# Patient Record
Sex: Male | Born: 1945
Health system: Southern US, Community
[De-identification: ages and names within clinical notes are randomized; demographics above are authoritative.]

## PROBLEM LIST (undated history)

## (undated) DIAGNOSIS — G4730 Sleep apnea, unspecified: Secondary | ICD-10-CM

## (undated) DIAGNOSIS — Z87442 Personal history of urinary calculi: Secondary | ICD-10-CM

## (undated) DIAGNOSIS — I10 Essential (primary) hypertension: Secondary | ICD-10-CM

## (undated) DIAGNOSIS — E785 Hyperlipidemia, unspecified: Secondary | ICD-10-CM

## (undated) DIAGNOSIS — M1990 Unspecified osteoarthritis, unspecified site: Secondary | ICD-10-CM

## (undated) DIAGNOSIS — H814 Vertigo of central origin: Secondary | ICD-10-CM

## (undated) DIAGNOSIS — C61 Malignant neoplasm of prostate: Secondary | ICD-10-CM

## (undated) HISTORY — DX: Essential (primary) hypertension: I10

## (undated) HISTORY — PX: TONSILLECTOMY: SUR1361

## (undated) HISTORY — PX: PROSTATE BIOPSY: SHX241

## (undated) HISTORY — DX: Unspecified osteoarthritis, unspecified site: M19.90

## (undated) HISTORY — PX: GLAUCOMA SURGERY: SHX656

---

## 1988-01-27 HISTORY — PX: CARPAL TUNNEL RELEASE: SHX101

## 1997-10-02 ENCOUNTER — Ambulatory Visit (HOSPITAL_COMMUNITY): Admission: RE | Admit: 1997-10-02 | Discharge: 1997-10-02 | Payer: Self-pay | Admitting: Internal Medicine

## 1998-11-04 ENCOUNTER — Ambulatory Visit (HOSPITAL_COMMUNITY): Admission: RE | Admit: 1998-11-04 | Discharge: 1998-11-04 | Payer: Self-pay | Admitting: Gastroenterology

## 1998-11-23 ENCOUNTER — Ambulatory Visit: Admission: RE | Admit: 1998-11-23 | Discharge: 1998-11-23 | Payer: Self-pay | Admitting: Internal Medicine

## 1999-03-24 ENCOUNTER — Ambulatory Visit (HOSPITAL_BASED_OUTPATIENT_CLINIC_OR_DEPARTMENT_OTHER): Admission: RE | Admit: 1999-03-24 | Discharge: 1999-03-25 | Payer: Self-pay | Admitting: Otolaryngology

## 1999-07-11 ENCOUNTER — Encounter: Payer: Self-pay | Admitting: Internal Medicine

## 1999-07-11 ENCOUNTER — Ambulatory Visit (HOSPITAL_COMMUNITY): Admission: RE | Admit: 1999-07-11 | Discharge: 1999-07-11 | Payer: Self-pay | Admitting: Internal Medicine

## 2001-09-01 ENCOUNTER — Encounter: Payer: Self-pay | Admitting: Internal Medicine

## 2001-09-01 ENCOUNTER — Encounter: Admission: RE | Admit: 2001-09-01 | Discharge: 2001-09-01 | Payer: Self-pay | Admitting: Internal Medicine

## 2001-09-14 ENCOUNTER — Ambulatory Visit (HOSPITAL_COMMUNITY): Admission: RE | Admit: 2001-09-14 | Discharge: 2001-09-14 | Payer: Self-pay | Admitting: Internal Medicine

## 2003-02-23 ENCOUNTER — Ambulatory Visit (HOSPITAL_COMMUNITY): Admission: RE | Admit: 2003-02-23 | Discharge: 2003-02-23 | Payer: Self-pay | Admitting: Internal Medicine

## 2003-04-15 ENCOUNTER — Ambulatory Visit (HOSPITAL_COMMUNITY): Admission: RE | Admit: 2003-04-15 | Discharge: 2003-04-15 | Payer: Self-pay | Admitting: Orthopedic Surgery

## 2005-01-23 ENCOUNTER — Encounter: Admission: RE | Admit: 2005-01-23 | Discharge: 2005-01-23 | Payer: Self-pay | Admitting: Internal Medicine

## 2005-04-15 ENCOUNTER — Ambulatory Visit (HOSPITAL_COMMUNITY): Admission: RE | Admit: 2005-04-15 | Discharge: 2005-04-15 | Payer: Self-pay | Admitting: Cardiology

## 2010-03-27 ENCOUNTER — Other Ambulatory Visit: Payer: Self-pay | Admitting: Otolaryngology

## 2010-05-02 ENCOUNTER — Other Ambulatory Visit: Payer: Self-pay | Admitting: Internal Medicine

## 2010-05-02 ENCOUNTER — Ambulatory Visit
Admission: RE | Admit: 2010-05-02 | Discharge: 2010-05-02 | Disposition: A | Discharge status: Home / Self Care | Payer: Medicare Other | Source: Ambulatory Visit | Attending: Internal Medicine | Admitting: Internal Medicine

## 2010-05-02 DIAGNOSIS — I10 Essential (primary) hypertension: Secondary | ICD-10-CM

## 2010-05-26 ENCOUNTER — Encounter: Payer: Self-pay | Admitting: Internal Medicine

## 2010-05-26 ENCOUNTER — Other Ambulatory Visit: Payer: Self-pay | Admitting: Internal Medicine

## 2010-05-26 DIAGNOSIS — N529 Male erectile dysfunction, unspecified: Secondary | ICD-10-CM | POA: Insufficient documentation

## 2010-05-26 DIAGNOSIS — G4733 Obstructive sleep apnea (adult) (pediatric): Secondary | ICD-10-CM | POA: Insufficient documentation

## 2010-05-26 DIAGNOSIS — E785 Hyperlipidemia, unspecified: Secondary | ICD-10-CM | POA: Insufficient documentation

## 2010-05-26 DIAGNOSIS — I10 Essential (primary) hypertension: Secondary | ICD-10-CM | POA: Insufficient documentation

## 2010-05-26 DIAGNOSIS — N400 Enlarged prostate without lower urinary tract symptoms: Secondary | ICD-10-CM | POA: Insufficient documentation

## 2011-09-04 ENCOUNTER — Other Ambulatory Visit: Payer: Self-pay | Admitting: Internal Medicine

## 2012-03-03 LAB — BASIC METABOLIC PANEL
BUN: 11 mg/dL (ref 4–21)
Creatinine: 1.1 mg/dL (ref 0.6–1.3)
Glucose: 95 mg/dL

## 2012-03-03 LAB — HEPATIC FUNCTION PANEL
AST: 16 U/L (ref 14–40)
Alkaline Phosphatase: 80 U/L (ref 25–125)

## 2012-04-11 ENCOUNTER — Encounter: Payer: Self-pay | Admitting: General Practice

## 2013-08-14 ENCOUNTER — Emergency Department (HOSPITAL_BASED_OUTPATIENT_CLINIC_OR_DEPARTMENT_OTHER)
Admission: EM | Admit: 2013-08-14 | Discharge: 2013-08-14 | Disposition: A | Discharge status: Home / Self Care | Payer: No Typology Code available for payment source | Attending: Emergency Medicine | Admitting: Emergency Medicine

## 2013-08-14 ENCOUNTER — Encounter (HOSPITAL_BASED_OUTPATIENT_CLINIC_OR_DEPARTMENT_OTHER): Payer: Self-pay | Admitting: Emergency Medicine

## 2013-08-14 ENCOUNTER — Emergency Department (HOSPITAL_BASED_OUTPATIENT_CLINIC_OR_DEPARTMENT_OTHER): Payer: No Typology Code available for payment source

## 2013-08-14 DIAGNOSIS — Z791 Long term (current) use of non-steroidal anti-inflammatories (NSAID): Secondary | ICD-10-CM | POA: Insufficient documentation

## 2013-08-14 DIAGNOSIS — IMO0002 Reserved for concepts with insufficient information to code with codable children: Secondary | ICD-10-CM | POA: Diagnosis present

## 2013-08-14 DIAGNOSIS — Z880 Allergy status to penicillin: Secondary | ICD-10-CM | POA: Diagnosis not present

## 2013-08-14 DIAGNOSIS — M1990 Unspecified osteoarthritis, unspecified site: Secondary | ICD-10-CM | POA: Diagnosis not present

## 2013-08-14 DIAGNOSIS — Y9389 Activity, other specified: Secondary | ICD-10-CM | POA: Insufficient documentation

## 2013-08-14 DIAGNOSIS — I10 Essential (primary) hypertension: Secondary | ICD-10-CM | POA: Diagnosis not present

## 2013-08-14 DIAGNOSIS — Y92410 Unspecified street and highway as the place of occurrence of the external cause: Secondary | ICD-10-CM | POA: Diagnosis not present

## 2013-08-14 DIAGNOSIS — M545 Low back pain, unspecified: Secondary | ICD-10-CM

## 2013-08-14 DIAGNOSIS — Z79899 Other long term (current) drug therapy: Secondary | ICD-10-CM | POA: Diagnosis not present

## 2013-08-14 MED ORDER — CYCLOBENZAPRINE HCL 5 MG PO TABS: 5.0000 mg | ORAL_TABLET | Freq: Three times a day (TID) | ORAL | Status: DC | PRN

## 2013-08-14 MED ORDER — IBUPROFEN 800 MG PO TABS: 800.0000 mg | ORAL_TABLET | Freq: Three times a day (TID) | ORAL | Status: DC

## 2013-08-14 MED ORDER — OXYCODONE-ACETAMINOPHEN 5-325 MG PO TABS
1.0000 | ORAL_TABLET | Freq: Once | ORAL | Status: DC
  Filled 2013-08-14: qty 1

## 2013-08-14 MED ORDER — OXYCODONE-ACETAMINOPHEN 5-325 MG PO TABS: 1.0000 | ORAL_TABLET | Freq: Four times a day (QID) | ORAL | Status: DC | PRN
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
## 2013-08-14 NOTE — Discharge Instructions (Signed)
Returnt to the ED with any concerns including weakness of legs, not able to urinate, loss of control of bowel or bladder, chest or abdominal pain, decreased level of alertness/lethargy, or any other alarming symptoms

## 2014-10-16 ENCOUNTER — Encounter (HOSPITAL_BASED_OUTPATIENT_CLINIC_OR_DEPARTMENT_OTHER): Payer: Self-pay

## 2014-10-16 ENCOUNTER — Emergency Department (HOSPITAL_BASED_OUTPATIENT_CLINIC_OR_DEPARTMENT_OTHER)
Admission: EM | Admit: 2014-10-16 | Discharge: 2014-10-16 | Disposition: A | Discharge status: Home / Self Care | Payer: Medicare Other | Attending: Emergency Medicine | Admitting: Emergency Medicine

## 2014-10-16 DIAGNOSIS — Z23 Encounter for immunization: Secondary | ICD-10-CM | POA: Diagnosis not present

## 2014-10-16 DIAGNOSIS — Y9389 Activity, other specified: Secondary | ICD-10-CM | POA: Diagnosis not present

## 2014-10-16 DIAGNOSIS — Y9289 Other specified places as the place of occurrence of the external cause: Secondary | ICD-10-CM | POA: Diagnosis not present

## 2014-10-16 DIAGNOSIS — I10 Essential (primary) hypertension: Secondary | ICD-10-CM | POA: Diagnosis not present

## 2014-10-16 DIAGNOSIS — Z8739 Personal history of other diseases of the musculoskeletal system and connective tissue: Secondary | ICD-10-CM | POA: Diagnosis not present

## 2014-10-16 DIAGNOSIS — Z79899 Other long term (current) drug therapy: Secondary | ICD-10-CM | POA: Diagnosis not present

## 2014-10-16 DIAGNOSIS — T63461A Toxic effect of venom of wasps, accidental (unintentional), initial encounter: Secondary | ICD-10-CM | POA: Diagnosis not present

## 2014-10-16 DIAGNOSIS — Y288XXA Contact with other sharp object, undetermined intent, initial encounter: Secondary | ICD-10-CM | POA: Insufficient documentation

## 2014-10-16 DIAGNOSIS — Y998 Other external cause status: Secondary | ICD-10-CM | POA: Diagnosis not present

## 2014-10-16 DIAGNOSIS — S61219A Laceration without foreign body of unspecified finger without damage to nail, initial encounter: Secondary | ICD-10-CM

## 2014-10-16 DIAGNOSIS — T63481A Toxic effect of venom of other arthropod, accidental (unintentional), initial encounter: Secondary | ICD-10-CM

## 2014-10-16 DIAGNOSIS — Z880 Allergy status to penicillin: Secondary | ICD-10-CM | POA: Insufficient documentation

## 2014-10-16 DIAGNOSIS — S61217A Laceration without foreign body of left little finger without damage to nail, initial encounter: Secondary | ICD-10-CM | POA: Diagnosis present

## 2014-10-16 MED ORDER — TETANUS-DIPHTH-ACELL PERTUSSIS 5-2.5-18.5 LF-MCG/0.5 IM SUSP
0.5000 mL | Freq: Once | INTRAMUSCULAR | Status: AC
  Administered 2014-10-16: 0.5 mL via INTRAMUSCULAR
  Filled 2014-10-16: qty 0.5

## 2014-10-16 MED ORDER — LIDOCAINE HCL (PF) 1 % IJ SOLN
5.0000 mL | Freq: Once | INTRAMUSCULAR | Status: AC
  Administered 2014-10-16: 5 mL
  Filled 2014-10-16: qty 5

## 2014-10-16 NOTE — Discharge Instructions (Signed)
Bee, Wasp, or Hornet Sting Your caregiver has diagnosed you as having an insect sting. An insect sting appears as a red lump in the skin that sometimes has a tiny hole in the center, or it may have a stinger in the center of the wound. The most common stings are from wasps, hornets and bees. Individuals have different reactions to insect stings.  A normal reaction may cause pain, swelling, and redness around the sting site.  A localized allergic reaction may cause swelling and redness that extends beyond the sting site.  A large local reaction may continue to develop over the next 12 to 36 hours.  On occasion, the reactions can be severe (anaphylactic reaction). An anaphylactic reaction may cause wheezing; difficulty breathing; chest pain; fainting; raised, itchy, red patches on the skin; a sick feeling to your stomach (nausea); vomiting; cramping; or diarrhea. If you have had an anaphylactic reaction to an insect sting in the past, you are more likely to have one again. HOME CARE INSTRUCTIONS   With bee stings, a small sac of poison is left in the wound. Brushing across this with something such as a credit card, or anything similar, will help remove this and decrease the amount of the reaction. This same procedure will not help a wasp sting as they do not leave behind a stinger and poison sac.  Apply a cold compress for 10 to 20 minutes every hour for 1 to 2 days, depending on severity, to reduce swelling and itching.  To lessen pain, a paste made of water and baking soda may be rubbed on the bite or sting and left on for 5 minutes.  To relieve itching and swelling, you may use take medication or apply medicated creams or lotions as directed.  Only take over-the-counter or prescription medicines for pain, discomfort, or fever as directed by your caregiver.  Wash the sting site daily with soap and water. Apply antibiotic ointment on the sting site as directed.  If you suffered a severe  reaction:  If you did not require hospitalization, an adult will need to stay with you for 24 hours in case the symptoms return.  You may need to wear a medical bracelet or necklace stating the allergy.  You and your family need to learn when and how to use an anaphylaxis kit or epinephrine injection.  If you have had a severe reaction before, always carry your anaphylaxis kit with you. SEEK MEDICAL CARE IF:   None of the above helps within 2 to 3 days.  The area becomes red, warm, tender, and swollen beyond the area of the bite or sting.  You have an oral temperature above 102 F (38.9 C). SEEK IMMEDIATE MEDICAL CARE IF:  You have symptoms of an allergic reaction which are:  Wheezing.  Difficulty breathing.  Chest pain.  Lightheadedness or fainting.  Itchy, raised, red patches on the skin.  Nausea, vomiting, cramping or diarrhea. ANY OF THESE SYMPTOMS MAY REPRESENT A SERIOUS PROBLEM THAT IS AN EMERGENCY. Do not wait to see if the symptoms will go away. Get medical help right away. Call your local emergency services (911 in [place] DO NOT drive yourself to the hospital. MAKE SURE YOU:   Understand these instructions.  Will watch your condition.  Will get help right away if you are not doing well or get worse. Document Released: 01/12/2005 Document Revised: 04/06/2011 Document Reviewed: 06/29/2009 [facility] Patient Information 2015 [place], [place]. This information is not intended to replace advice given  to you by your health care provider. Make sure you discuss any questions you have with your health care provider.  Laceration Care, Adult A laceration is a cut or lesion that goes through all layers of the skin and into the tissue just beneath the skin. TREATMENT  Some lacerations may not require closure. Some lacerations may not be able to be closed due to an increased risk of infection. It is important to see your caregiver as soon as possible after an injury to minimize  the risk of infection and maximize the opportunity for successful closure. If closure is appropriate, pain medicines may be given, if needed. The wound will be cleaned to help prevent infection. Your caregiver will use stitches (sutures), staples, wound glue (adhesive), or skin adhesive strips to repair the laceration. These tools bring the skin edges together to allow for faster healing and a better cosmetic outcome. However, all wounds will heal with a scar. Once the wound has healed, scarring can be minimized by covering the wound with sunscreen during the day for 1 full year. HOME CARE INSTRUCTIONS  For sutures or staples:  Keep the wound clean and dry.  If you were given a bandage (dressing), you should change it at least once a day. Also, change the dressing if it becomes wet or dirty, or as directed by your caregiver.  Wash the wound with soap and water 2 times a day. Rinse the wound off with water to remove all soap. Pat the wound dry with a clean towel.  After cleaning, apply a thin layer of the antibiotic ointment as recommended by your caregiver. This will help prevent infection and keep the dressing from sticking.  You may shower as usual after the first 24 hours. Do not soak the wound in water until the sutures are removed.  Only take over-the-counter or prescription medicines for pain, discomfort, or fever as directed by your caregiver.  Get your sutures or staples removed as directed by your caregiver. For skin adhesive strips:  Keep the wound clean and dry.  Do not get the skin adhesive strips wet. You may bathe carefully, using caution to keep the wound dry.  If the wound gets wet, pat it dry with a clean towel.  Skin adhesive strips will fall off on their own. You may trim the strips as the wound heals. Do not remove skin adhesive strips that are still stuck to the wound. They will fall off in time. For wound adhesive:  You may briefly wet your wound in the shower or  bath. Do not soak or scrub the wound. Do not swim. Avoid periods of heavy perspiration until the skin adhesive has fallen off on its own. After showering or bathing, gently pat the wound dry with a clean towel.  Do not apply liquid medicine, cream medicine, or ointment medicine to your wound while the skin adhesive is in place. This may loosen the film before your wound is healed.  If a dressing is placed over the wound, be careful not to apply tape directly over the skin adhesive. This may cause the adhesive to be pulled off before the wound is healed.  Avoid prolonged exposure to sunlight or tanning lamps while the skin adhesive is in place. Exposure to ultraviolet light in the first year will darken the scar.  The skin adhesive will usually remain in place for 5 to 10 days, then naturally fall off the skin. Do not pick at the adhesive film. You may need  a tetanus shot if:  You cannot remember when you had your last tetanus shot.  You have never had a tetanus shot. If you get a tetanus shot, your arm may swell, get red, and feel warm to the touch. This is common and not a problem. If you need a tetanus shot and you choose not to have one, there is a rare chance of getting tetanus. Sickness from tetanus can be serious. SEEK MEDICAL CARE IF:   You have redness, swelling, or increasing pain in the wound.  You see a red line that goes away from the wound.  You have yellowish-white fluid (pus) coming from the wound.  You have a fever.  You notice a bad smell coming from the wound or dressing.  Your wound breaks open before or after sutures have been removed.  You notice something coming out of the wound such as wood or glass.  Your wound is on your hand or foot and you cannot move a finger or toe. SEEK IMMEDIATE MEDICAL CARE IF:   Your pain is not controlled with prescribed medicine.  You have severe swelling around the wound causing pain and numbness or a change in color in your  arm, hand, leg, or foot.  Your wound splits open and starts bleeding.  You have worsening numbness, weakness, or loss of function of any joint around or beyond the wound.  You develop painful lumps near the wound or on the skin anywhere on your body. MAKE SURE YOU:   Understand these instructions.  Will watch your condition.  Will get help right away if you are not doing well or get worse. Document Released: 01/12/2005 Document Revised: 04/06/2011 Document Reviewed: 07/08/2010 [facility] Patient Information 2015 [place], [place]. This information is not intended to replace advice given to you by your health care provider. Make sure you discuss any questions you have with your health care provider.

## 2014-10-16 NOTE — ED Notes (Signed)
dsg and splint applied to left 5 digit as ordered by EDP

## 2014-10-16 NOTE — ED Notes (Addendum)
C/o lac to left index finger while cutting weeds and was stung by yellow jackets

## 2014-10-16 NOTE — ED Notes (Signed)
Verified tetanus status with pt

## 2014-10-16 NOTE — ED Notes (Signed)
MD at bedside. 

## 2014-10-16 NOTE — ED Notes (Signed)
Pt teaching done re: S&S of infection

## 2014-10-16 NOTE — ED Provider Notes (Signed)
CSN: [id number]     Arrival date & time 10/16/14  1157 History   First MD Initiated Contact with Patient 10/16/14 1206     Chief Complaint  Patient presents with  . Extremity Laceration  . Insect Bite     (Consider location/radiation/quality/duration/timing/severity/associated sxs/prior Treatment) The history is provided by the patient.   patient was out working in the ER. States that he had to yellow jacket stings. One to his left head/face wander with right upper extremity. No history of allergies. States the stings burn and itch. Also had a laceration to his left little finger. States that he caught it with the tool he was cutting the bushes with. States continues to bleed even though it was around 3 hours ago. He is not on anticoagulation. No numbness or weakness.  Past Medical History  Diagnosis Date  . Hypertension   . Degenerative joint disease    History reviewed. No pertinent past surgical history. No family history on file. Social History  Substance Use Topics  . Smoking status: Never Smoker   . Smokeless tobacco: None  . Alcohol Use: No    Review of Systems  Constitutional: Negative for diaphoresis and fatigue.  HENT: Negative for trouble swallowing and voice change.   Respiratory: Negative for shortness of breath.   Cardiovascular: Negative for chest pain.  Gastrointestinal: Negative for abdominal pain.  Skin: Positive for wound.  Neurological: Negative for weakness and numbness.  Hematological: Does not bruise/bleed easily.      Allergies  Penicillins  Home Medications   Prior to Admission medications   Medication Sig Start Date End Date Taking? Authorizing Provider  doxazosin (CARDURA) 4 MG tablet Take 4 mg by mouth at bedtime.    Historical Provider, MD  finasteride (PROSCAR) 5 MG tablet Take 5 mg by mouth daily.    Historical Provider, MD  rosuvastatin (CRESTOR) 5 MG tablet Take 5 mg by mouth daily.    Historical Provider, MD   BP 147/88 mmHg   Pulse 90  Temp(Src) 98.3 F (36.8 C) (Oral)  Resp 18  Ht 5\' 10"  (1.778 m)  Wt 190 lb (86.183 kg)  BMI 27.26 kg/m2  SpO2 100% Physical Exam  Constitutional: He appears well-developed.  HENT:  Mild swelling to left temporal area from yellow jacket sting.  Cardiovascular: Normal rate.   Pulmonary/Chest: Effort normal.  Abdominal: Soft.  Musculoskeletal: He exhibits no tenderness.  Skin: Skin is warm.  1 cm laceration horizontally across distal phalanx of left index finger. Just distal to the DIP joint. Good flexion-extension at the DIP joint. Sensation intact distally. Nail does not appear to be involved.    ED Course  Procedures (including critical care time) Labs Review Labs Reviewed - No data to display  Imaging Review No results found. I have personally reviewed and evaluated these images and lab results as part of my medical decision-making.   EKG Interpretation None      MDM   Final diagnoses:  Finger laceration, initial encounter  Insect sting, accidental or unintentional, initial encounter    Patient with finger laceration and yellow jacket stings. Laceration closed and tetanus updated. Stings just require symptomatic relief. Will discharge home.  LACERATION REPAIR Performed by: Mackie Pai Authorized by: Mackie Pai Consent: Verbal consent obtained. Risks and benefits: risks, benefits and alternatives were discussed Consent given by: patient Patient identity confirmed: provided demographic data Prepped and Draped in normal sterile fashion Wound explored  Laceration Location: left 5th finger  Laceration Length: 1cm  No Foreign Bodies seen or palpated  Anesthesia: digital block  Local anesthetic: lidocaine 1%   Anesthetic total: 2 ml  Irrigation method: syringe Amount of cleaning: standard  Skin closure: 5-0 vicryl rapide  Number of sutures: 3  Technique: simple interrupted.  Patient tolerance: Patient tolerated the  procedure well with no immediate complications.  Davonna Belling, MD 10/16/14 [phone]

## 2014-10-16 NOTE — ED Notes (Signed)
Pt rec laceration, irregular in shape to left 5 digit from yard work today, mild active bleeding noted, easily controlled

## 2014-10-23 ENCOUNTER — Encounter (HOSPITAL_BASED_OUTPATIENT_CLINIC_OR_DEPARTMENT_OTHER): Payer: Self-pay | Admitting: *Deleted

## 2014-10-23 ENCOUNTER — Emergency Department (HOSPITAL_BASED_OUTPATIENT_CLINIC_OR_DEPARTMENT_OTHER)
Admission: EM | Admit: 2014-10-23 | Discharge: 2014-10-23 | Disposition: A | Discharge status: Home / Self Care | Payer: Medicare Other | Attending: Emergency Medicine | Admitting: Emergency Medicine

## 2014-10-23 DIAGNOSIS — I10 Essential (primary) hypertension: Secondary | ICD-10-CM | POA: Diagnosis not present

## 2014-10-23 DIAGNOSIS — Z79899 Other long term (current) drug therapy: Secondary | ICD-10-CM | POA: Diagnosis not present

## 2014-10-23 DIAGNOSIS — Z4802 Encounter for removal of sutures: Secondary | ICD-10-CM | POA: Diagnosis present

## 2014-10-23 DIAGNOSIS — Z8739 Personal history of other diseases of the musculoskeletal system and connective tissue: Secondary | ICD-10-CM | POA: Diagnosis not present

## 2014-10-23 DIAGNOSIS — Z880 Allergy status to penicillin: Secondary | ICD-10-CM | POA: Diagnosis not present

## 2014-10-23 NOTE — ED Notes (Signed)
Patient is here for suture removal from left fifth finger.

## 2014-10-23 NOTE — ED Provider Notes (Signed)
CSN: [id number]     Arrival date & time 10/23/14  3875 History   First MD Initiated Contact with Patient 10/23/14 [phone]     Chief Complaint  Patient presents with  . Suture / Staple Removal    Patient is a 69 y.o. male presenting with suture removal.  Suture / Staple Removal   KEY CEN is a 69 y.o. male who presents to ED for suture removal s/p laceration to his left 5th finger. Patient does not report any problems with wound healing. He has good function of finger without pain. Has no other complaints.    Past Medical History  Diagnosis Date  . Hypertension   . Degenerative joint disease    Past Surgical History  Procedure Laterality Date  . Carpal tunnel release  1990   No family history on file. Social History  Substance Use Topics  . Smoking status: Never Smoker   . Smokeless tobacco: None  . Alcohol Use: No    Review of Systems  All other systems reviewed and are negative.  Allergies  Penicillins  Home Medications   Prior to Admission medications   Medication Sig Start Date End Date Taking? Authorizing Provider  PRESCRIPTION MEDICATION Eye drops for Glaucoma   Yes Historical Provider, MD  doxazosin (CARDURA) 4 MG tablet Take 4 mg by mouth at bedtime.    Historical Provider, MD  finasteride (PROSCAR) 5 MG tablet Take 5 mg by mouth daily.    Historical Provider, MD  rosuvastatin (CRESTOR) 5 MG tablet Take 5 mg by mouth daily.    Historical Provider, MD   BP 153/83 mmHg  Pulse 76  Temp(Src) 97.7 F (36.5 C) (Oral)  Resp 20  Ht 5\' 6"  (1.676 m)  Wt 198 lb (89.812 kg)  BMI 31.97 kg/m2  SpO2 100% Physical Exam  Constitutional: He appears well-developed and well-nourished. No distress.  HENT:  Head: Normocephalic and atraumatic.  Eyes: EOM are normal.  Cardiovascular: Normal rate.   Pulmonary/Chest: Effort normal.  Musculoskeletal:  1 cm laceration horizontally across distal phalanx of left index finger that is well healed. Three sutures in place.  Good flexion-extension at the DIP joint. Sensation intact.    Neurological: He is alert.  Skin: Skin is warm and dry.  Psychiatric: He has a normal mood and affect.    ED Course  Procedures (including critical care time) Labs Review Labs Reviewed - No data to display  Imaging Review No results found. I have personally reviewed and evaluated these images and lab results as part of my medical decision-making.   EKG Interpretation None      MDM   Final diagnoses:  Encounter for removal of sutures   Patient presented to ED for suture removal. Procedure note below. Function and strength intact. Laceration well healed without signs of infection.    SUTURE REMOVAL Performed by: Luiz Blare  Consent: Verbal consent obtained. Patient identity confirmed: provided demographic data Time out: Immediately prior to procedure a "time out" was called to verify the correct patient, procedure, equipment, support staff and site/side marked as required.  Location details: left 5th finger  Wound Appearance: clean  Sutures/Staples Removed: 3 sutures removed  Facility: sutures placed in this facility Patient tolerance: Patient tolerated the procedure well with no immediate complications.   Luiz Blare, DO 10/23/2014, 9:07 AM PGY-2, [place], DO 10/23/14 2951  Charlesetta Shanks, MD 10/23/14 [phone]

## 2014-10-23 NOTE — Discharge Instructions (Signed)

## 2016-05-20 ENCOUNTER — Emergency Department (HOSPITAL_BASED_OUTPATIENT_CLINIC_OR_DEPARTMENT_OTHER)
Admission: EM | Admit: 2016-05-20 | Discharge: 2016-05-20 | Disposition: A | Discharge status: Home / Self Care | Payer: Medicare Other | Attending: Emergency Medicine | Admitting: Emergency Medicine

## 2016-05-20 ENCOUNTER — Encounter (HOSPITAL_BASED_OUTPATIENT_CLINIC_OR_DEPARTMENT_OTHER): Payer: Self-pay | Admitting: Emergency Medicine

## 2016-05-20 DIAGNOSIS — Z79899 Other long term (current) drug therapy: Secondary | ICD-10-CM | POA: Diagnosis not present

## 2016-05-20 DIAGNOSIS — R112 Nausea with vomiting, unspecified: Secondary | ICD-10-CM | POA: Diagnosis not present

## 2016-05-20 DIAGNOSIS — R42 Dizziness and giddiness: Secondary | ICD-10-CM | POA: Diagnosis not present

## 2016-05-20 DIAGNOSIS — I10 Essential (primary) hypertension: Secondary | ICD-10-CM | POA: Insufficient documentation

## 2016-05-20 LAB — CBC WITH DIFFERENTIAL/PLATELET
BASOS ABS: 0 10*3/uL (ref 0.0–0.1)
BASOS PCT: 0 %
EOS PCT: 1 %
Eosinophils Absolute: 0.1 10*3/uL (ref 0.0–0.7)
HCT: 45.4 % (ref 39.0–52.0)
Hemoglobin: 15.6 g/dL (ref 13.0–17.0)
Lymphocytes Relative: 18 %
Lymphs Abs: 1.4 10*3/uL (ref 0.7–4.0)
MCH: 31 pg (ref 26.0–34.0)
MCHC: 34.4 g/dL (ref 30.0–36.0)
MCV: 90.3 fL (ref 78.0–100.0)
MONO ABS: 0.7 10*3/uL (ref 0.1–1.0)
MONOS PCT: 10 %
Neutro Abs: 5.3 10*3/uL (ref 1.7–7.7)
Neutrophils Relative %: 71 %
PLATELETS: 181 10*3/uL (ref 150–400)
RBC: 5.03 MIL/uL (ref 4.22–5.81)
RDW: 14.1 % (ref 11.5–15.5)
WBC: 7.6 10*3/uL (ref 4.0–10.5)

## 2016-05-20 LAB — COMPREHENSIVE METABOLIC PANEL
ALT: 23 U/L (ref 17–63)
ANION GAP: 9 (ref 5–15)
AST: 19 U/L (ref 15–41)
Albumin: 4.6 g/dL (ref 3.5–5.0)
Alkaline Phosphatase: 70 U/L (ref 38–126)
BUN: 13 mg/dL (ref 6–20)
CHLORIDE: 103 mmol/L (ref 101–111)
CO2: 27 mmol/L (ref 22–32)
Calcium: 9.4 mg/dL (ref 8.9–10.3)
Creatinine, Ser: 1.26 mg/dL — ABNORMAL HIGH (ref 0.61–1.24)
GFR calc Af Amer: >60 mL/min (ref >60)
GFR, EST NON AFRICAN AMERICAN: 56 mL/min — AB (ref >60)
Glucose, Bld: 108 mg/dL — ABNORMAL HIGH (ref 65–99)
POTASSIUM: 4.2 mmol/L (ref 3.5–5.1)
Sodium: 139 mmol/L (ref 135–145)
TOTAL PROTEIN: 8.3 g/dL — AB (ref 6.5–8.1)
Total Bilirubin: 0.6 mg/dL (ref 0.3–1.2)

## 2016-05-20 LAB — LIPASE, BLOOD: LIPASE: 25 U/L (ref 11–51)

## 2016-05-20 LAB — TROPONIN I

## 2016-05-20 MED ORDER — MECLIZINE HCL 12.5 MG PO TABS: 12.5000 mg | ORAL_TABLET | Freq: Three times a day (TID) | ORAL | 0 refills | Status: AC | PRN

## 2016-05-20 MED ORDER — ONDANSETRON HCL 4 MG PO TABS: 4.0000 mg | ORAL_TABLET | Freq: Four times a day (QID) | ORAL | 0 refills | Status: DC

## 2016-05-20 MED ORDER — ONDANSETRON HCL 4 MG/2ML IJ SOLN
4.0000 mg | Freq: Once | INTRAMUSCULAR | Status: AC
  Administered 2016-05-20: 4 mg via INTRAVENOUS
  Filled 2016-05-20: qty 2

## 2016-05-20 MED ORDER — MECLIZINE HCL 25 MG PO TABS
12.5000 mg | ORAL_TABLET | Freq: Once | ORAL | Status: AC
  Administered 2016-05-20: 12.5 mg via ORAL
  Filled 2016-05-20: qty 1

## 2016-05-20 MED ORDER — SODIUM CHLORIDE 0.9 % IV BOLUS (SEPSIS)
1000.0000 mL | Freq: Once | INTRAVENOUS | Status: AC
  Administered 2016-05-20: 1000 mL via INTRAVENOUS

## 2016-05-20 MED ORDER — ONDANSETRON 4 MG PO TBDP: 4.0000 mg | ORAL_TABLET | Freq: Once | ORAL | Status: DC

## 2016-05-20 MED FILL — ONDANSETRON HCL 4 MG TABLET: 4 | 4 days supply | Qty: 12 | Fill #0

## 2016-05-20 NOTE — ED Notes (Signed)
Ambulated in hall, denies nausea or dizziness but c/o of weakness. ED MD informed

## 2016-05-20 NOTE — Discharge Instructions (Signed)
We believe your symptoms were caused by benign vertigo.  Please read through the included information and take any prescribed medication(s).  Follow up with your doctor as listed above.  If you develop any new or worsening symptoms that concern you, including but not limited to persistent dizziness/vertigo, numbness or weakness in your arms or legs, altered mental status, persistent vomiting, or fever greater than 101, please return immediately to the Emergency Department.  

## 2016-05-20 NOTE — ED Triage Notes (Signed)
Dizziness since last night, worse when moving head and n/v . Denies H/A

## 2016-05-20 NOTE — ED Provider Notes (Signed)
Emergency Department Provider Note   I have reviewed the triage vital signs and the nursing notes.   HISTORY  Chief Complaint Emesis   HPI Adam Mathis is a 71 y.o. male with PMH of DJD and HTN presents to the emergency department for evaluation of vertigo with nausea and vomiting. Patient states his symptoms began yesterday. No history of prior vertigo. Patient states that when he moves his head or stands up he gets worsening symptoms then has significant nausea with vomiting. He denies any unilateral weakness or numbness. No voice changes. No recent falls. Denies any earache, tinnitus, or other hearing difficulty. No fevers or chills. No neck stiffness. Symptoms are nonradiating, intermittent, and moderate. No CP, dyspnea, palpitations, or back pain.    Past Medical History:  Diagnosis Date  . Degenerative joint disease   . Hypertension     Patient Active Problem List   Diagnosis Date Noted  . Essential hypertension 05/26/2010  . BPH (benign prostatic hypertrophy) 05/26/2010  . Obstructive sleep apnea of adult 05/26/2010  . ED (erectile dysfunction) of organic origin 05/26/2010  . Hyperlipidemia LDL goal < 100 05/26/2010    Past Surgical History:  Procedure Laterality Date  . CARPAL TUNNEL RELEASE  1990  . GLAUCOMA SURGERY Left     Current Outpatient Rx  . Order #: [id number] Class: Historical Med  . Order #: [id number] Class: Historical Med  . Order #: [id number] Class: Print  . Order #: [id number] Class: Print  . Order #: [id number] Class: Historical Med  . Order #: [id number] Class: Historical Med    Allergies Penicillins  No family history on file.  Social History Social History  Substance Use Topics  . Smoking status: Never Smoker  . Smokeless tobacco: Never Used  . Alcohol use No    Review of Systems  Constitutional: No fever/chills. Positive generalized weakness.  Eyes: No visual changes. ENT: No sore throat. Positive positional vertigo.  Cardiovascular:  Denies chest pain. Respiratory: Denies shortness of breath. Gastrointestinal: No abdominal pain. Positive nausea and vomiting.  No diarrhea.  No constipation. Genitourinary: Negative for dysuria. Musculoskeletal: Negative for back pain. Skin: Negative for rash. Neurological: Negative for headaches, focal weakness or numbness.  10-point ROS otherwise negative.  ____________________________________________   PHYSICAL EXAM:  VITAL SIGNS: Vitals:   05/20/16 1200 05/20/16 1230  BP: (!) 166/86 (!) 161/87  Pulse: 64 63  Resp: 15 11    Constitutional: Alert and oriented. Well appearing and in no acute distress. Eyes: Conjunctivae are normal. PERRL. EOMI. Nystagmus with head turn to the right with Dix-Hallpike.  Head: Atraumatic. Ears:  Healthy appearing ear canals and TMs bilaterally Nose: No congestion/rhinnorhea. Mouth/Throat: Mucous membranes are dry. Oropharynx non-erythematous. Neck: No stridor.   Cardiovascular: Normal rate, regular rhythm. Good peripheral circulation. Grossly normal heart sounds.   Respiratory: Normal respiratory effort.  No retractions. Lungs CTAB. Gastrointestinal: Soft and nontender. No distention.  Musculoskeletal: No lower extremity tenderness nor edema. No gross deformities of extremities. Neurologic:  Normal speech and language. No gross focal neurologic deficits are appreciated. Normal finger-to-nose, normal heel-to-shin. Normal gait. No CN deficits 2-12.  Skin:  Skin is warm, dry and intact. No rash noted.   ____________________________________________   LABS (all labs ordered are listed, but only abnormal results are displayed)  Labs Reviewed  COMPREHENSIVE METABOLIC PANEL - Abnormal; Notable for the following:       Result Value   Glucose, Bld 108 (*)    Creatinine, Ser 1.26 (*)    Total Protein 8.3 (*)  GFR calc non Af Amer 56 (*)    All other components within normal limits  CBC WITH DIFFERENTIAL/PLATELET  TROPONIN I  LIPASE, BLOOD     ____________________________________________  EKG  Rate: 58 PR: 168 QTc: 433 Sinus rhythm. Normal axis. Non-specific T wave inversions inferiorly and laterally. Non-specific ST changes.    EKG Interpretation  Date/Time:  Wednesday May 20 2016 13:20:59 EDT Ventricular Rate:  54 PR Interval:    QRS Duration: 90 QT Interval:  461 QTC Calculation: 437 R Axis:   67 Text Interpretation:  Sinus rhythm Abnormal T, consider ischemia, diffuse leads No old tracing for comparison.  Confirmed by LONG MD, JOSHUA [phone]) on 05/20/2016 2:22:23 PM      ____________________________________________   PROCEDURES  Procedure(s) performed:   Procedures  None ____________________________________________   INITIAL IMPRESSION / ASSESSMENT AND PLAN / ED COURSE  Pertinent labs & imaging results that were available during my care of the patient were reviewed by me and considered in my medical decision making (see chart for details).  Patient presents to the emergency department for evaluation of intermittent vertigo symptoms made worse with movement. Patient has no focal neurological deficits. He has normal finger to nose testing, heel to shin testing, and no pronator drift. Normal rapid alternating movements. The patient does have a positive Dix-Hallpike with head turning to the right. Ear exam normal. EKG shows some nonspecific ST changes with no old tracings for comparison. Patient has no anginal equivalent symptoms or other reason to suspect atypical ACS. Plan for labs, troponin, and reassess after symptom treatment.   Patient feeling better after meclizine. Ambulates without difficulty or vertigo symptoms. Plan for discharge with PCP follow up. No evidence of suggest central vertigo.   01:00 PM At time of discharge the patient had additional nausea and vomiting. No vertigo preceding this. Suspect viral illness. Plan to treat with Zofran and reassess prior to discharge.   Repeat EKG  unremarkable. Patient feeling much better after Zofran. Plan for PCP follow up and oral hydration at home.   At this time, I do not feel there is any life-threatening condition present. I have reviewed and discussed all results (EKG, imaging, lab, urine as appropriate), exam findings with patient. I have reviewed nursing notes and appropriate previous records.  I feel the patient is safe to be discharged home without further emergent workup. Discussed usual and customary return precautions. Patient and family (if present) verbalize understanding and are comfortable with this plan.  Patient will follow-up with their primary care provider. If they do not have a primary care provider, information for follow-up has been provided to them. All questions have been answered.  ____________________________________________  FINAL CLINICAL IMPRESSION(S) / ED DIAGNOSES  Final diagnoses:  Vertigo  Non-intractable vomiting with nausea, unspecified vomiting type     MEDICATIONS GIVEN DURING THIS VISIT:  Medications  meclizine (ANTIVERT) tablet 12.5 mg (12.5 mg Oral Given 05/20/16 1110)  sodium chloride 0.9 % bolus 1,000 mL (0 mLs Intravenous Stopped 05/20/16 1248)  ondansetron (ZOFRAN) injection 4 mg (4 mg Intravenous Given 05/20/16 1258)     NEW OUTPATIENT MEDICATIONS STARTED DURING THIS VISIT:  Discharge Medication List as of 05/20/2016 12:50 PM    START taking these medications   Details  meclizine (ANTIVERT) 12.5 MG tablet Take 1 tablet (12.5 mg total) by mouth 3 (three) times daily as needed for dizziness., Starting Wed 05/20/2016, Print         Note:  This document was prepared using Dragon voice  recognition software and may include unintentional dictation errors.  Nanda Quinton, MD Emergency Medicine   Margette Fast, MD 05/20/16 [phone]

## 2017-06-03 ENCOUNTER — Other Ambulatory Visit: Payer: Self-pay | Admitting: Urology

## 2017-06-03 DIAGNOSIS — C61 Malignant neoplasm of prostate: Secondary | ICD-10-CM

## 2017-06-09 ENCOUNTER — Ambulatory Visit (HOSPITAL_COMMUNITY)
Admission: RE | Admit: 2017-06-09 | Discharge: 2017-06-09 | Disposition: A | Discharge status: Home / Self Care | Payer: Medicare Other | Source: Ambulatory Visit | Attending: Urology | Admitting: Urology

## 2017-06-09 ENCOUNTER — Encounter (HOSPITAL_COMMUNITY): Payer: Self-pay

## 2017-06-09 DIAGNOSIS — C61 Malignant neoplasm of prostate: Secondary | ICD-10-CM | POA: Insufficient documentation

## 2017-06-09 DIAGNOSIS — R937 Abnormal findings on diagnostic imaging of other parts of musculoskeletal system: Secondary | ICD-10-CM | POA: Insufficient documentation

## 2017-06-09 MED ORDER — TECHNETIUM TC 99M MEDRONATE IV KIT
20.0000 | PACK | Freq: Once | INTRAVENOUS | Status: AC | PRN
  Administered 2017-06-09: 21.5 via INTRAVENOUS

## 2017-06-15 ENCOUNTER — Other Ambulatory Visit: Payer: Self-pay | Admitting: Urology

## 2017-06-15 DIAGNOSIS — C61 Malignant neoplasm of prostate: Secondary | ICD-10-CM

## 2017-06-24 ENCOUNTER — Ambulatory Visit (HOSPITAL_COMMUNITY)
Admission: RE | Admit: 2017-06-24 | Discharge: 2017-06-24 | Disposition: A | Discharge status: Home / Self Care | Payer: Medicare Other | Source: Ambulatory Visit | Attending: Urology | Admitting: Urology

## 2017-06-24 ENCOUNTER — Ambulatory Visit (HOSPITAL_COMMUNITY): Payer: Medicare Other

## 2017-06-24 DIAGNOSIS — M48061 Spinal stenosis, lumbar region without neurogenic claudication: Secondary | ICD-10-CM | POA: Insufficient documentation

## 2017-06-24 DIAGNOSIS — C61 Malignant neoplasm of prostate: Secondary | ICD-10-CM | POA: Insufficient documentation

## 2017-06-24 DIAGNOSIS — M899 Disorder of bone, unspecified: Secondary | ICD-10-CM | POA: Diagnosis not present

## 2017-06-24 LAB — POCT I-STAT CREATININE: CREATININE: 1.1 mg/dL (ref 0.61–1.24)

## 2017-06-24 MED ORDER — GADOPENTETATE DIMEGLUMINE 469.01 MG/ML IV SOLN
20.0000 mL | Freq: Once | INTRAVENOUS | Status: AC | PRN
  Administered 2017-06-24: 19 mL via INTRAVENOUS

## 2017-12-15 ENCOUNTER — Encounter: Payer: Self-pay | Admitting: Oncology

## 2017-12-15 ENCOUNTER — Telehealth: Payer: Self-pay | Admitting: Oncology

## 2017-12-15 NOTE — Telephone Encounter (Signed)
New referral received from [facility] for prostate cancer. Pt has been scheduled to see Dr. Alen Blew on 12/5 at 11am. Pt aware to arrive 30 minutes early. Letter mailed.

## 2017-12-30 ENCOUNTER — Encounter: Payer: Self-pay | Admitting: Medical Oncology

## 2017-12-30 ENCOUNTER — Inpatient Hospital Stay: Payer: Medicare Other | Attending: Oncology | Admitting: Oncology

## 2017-12-30 VITALS — BP 159/69 | HR 72 | Temp 97.9°F | Resp 17 | Ht 73.0 in | Wt 208.9 lb

## 2017-12-30 DIAGNOSIS — M8580 Other specified disorders of bone density and structure, unspecified site: Secondary | ICD-10-CM

## 2017-12-30 DIAGNOSIS — I10 Essential (primary) hypertension: Secondary | ICD-10-CM | POA: Diagnosis not present

## 2017-12-30 DIAGNOSIS — C61 Malignant neoplasm of prostate: Secondary | ICD-10-CM

## 2017-12-30 NOTE — Progress Notes (Signed)
Introduced myself to Adam Mathis as the prostate nurse navigator. He was seen by Dr. Alen Blew today for his advanced prostate cancer. He received Lupron 08/31/17 under the care of Dr. Gloriann Loan. His last PSA in September was undetectable. He states that Dr. Alen Blew is pleased with his progress and will see him as needed in the future. I asked him to call me with questions or concerns.

## 2017-12-30 NOTE — Progress Notes (Signed)
Reason for the request:   Prostate cancer  HPI: I was asked by Dr. Gloriann Loan   to evaluate Mr. Knapke for prostate cancer.  He is a pleasant 72 year old man currently of [place] originally from [place].  He has a history of hypertension and carpal tunnel surgery and found to have an elevated PSA 32 and November 2018.  He was evaluated by Dr. Gloriann Loan and subsequently underwent a biopsy of his prostate on May 27, 2017.  The biopsy showed high-volume high-grade disease with a Gleason score 4+4 = 8 and multiple cores with tertiary pattern of 4+3 equal 7.  All 12 cores had cancer.  Staging work-up including CT scan of the abdomen and pelvis did not show any visceral metastasis.  Bone scan obtained on Jun 09, 2017 showed numerous degenerative changes although there is nonspecific area of the T12 that is suspicious for malignancy.  MRI of the lumbar spine showed a 1.5 cm sclerotic lesion in the T11 vertebral body concerning for metastasis.  Based on these findings, he was started on androgen deprivation therapy in the form of [place] and started on [place] as well.  His PSA was undetectable in September 2019.  Gillermina Phy was discontinued because of poor tolerance.  His case was discussed in the genitourinary multidisciplinary tumor board and radiation therapy was recommended but the patient declined.  Clinically, he feels well at this time after stopping Xtandi.  He denies any abdominal pain, dyspepsia or GI symptoms that he had experienced on Xtandi.  Denies any excessive fatigue or tiredness.  He does report some hot flashes which have also improved.  Has performance status and quality of life remains excellent.  He does not report any headaches, blurry vision, syncope or seizures. Does not report any fevers, chills or sweats.  Does not report any cough, wheezing or hemoptysis.  Does not report any chest pain, palpitation, orthopnea or leg edema.  Does not report any nausea, vomiting or abdominal pain.  Does not  report any constipation or diarrhea.  Does not report any skeletal complaints.    Does not report frequency, urgency or hematuria.  Does not report any skin rashes or lesions. Does not report any heat or cold intolerance.  Does not report any lymphadenopathy or petechiae.  Does not report any anxiety or depression.  Remaining review of systems is negative.    Past Medical History:  Diagnosis Date  . Degenerative joint disease   . Hypertension   :  Past Surgical History:  Procedure Laterality Date  . CARPAL TUNNEL RELEASE  1990  . GLAUCOMA SURGERY Left   :   Current Outpatient Medications:  .  doxazosin (CARDURA) 4 MG tablet, Take 4 mg by mouth at bedtime., Disp: , Rfl:  .  finasteride (PROSCAR) 5 MG tablet, Take 5 mg by mouth daily., Disp: , Rfl:  .  loratadine (CLARITIN) 10 MG tablet, Take 10 mg by mouth daily., Disp: , Rfl: 3 .  meclizine (ANTIVERT) 12.5 MG tablet, Take 1 tablet (12.5 mg total) by mouth 3 (three) times daily as needed for dizziness., Disp: 30 tablet, Rfl: 0 .  ondansetron (ZOFRAN) 4 MG tablet, Take 1 tablet (4 mg total) by mouth every 6 (six) hours., Disp: 12 tablet, Rfl: 0 .  PRESCRIPTION MEDICATION, Eye drops for Glaucoma, Disp: , Rfl:  .  rosuvastatin (CRESTOR) 5 MG tablet, Take 5 mg by mouth daily., Disp: , Rfl:  .  timolol (TIMOPTIC) 0.5 % ophthalmic solution, PLACE 1 DROP INTO THE RIGHT  EYE 2 TIMES DAILY., Disp: , Rfl: 11 .  XTANDI 40 MG capsule, , Disp: , Rfl: :  Allergies  Allergen Reactions  . Penicillins Rash  :  No family history on file.:  Social History   Socioeconomic History  . Marital status: Widowed    Spouse name: Not on file  . Number of children: Not on file  . Years of education: Not on file  . Highest education level: Not on file  Occupational History  . Not on file  Social Needs  . Financial resource strain: Not on file  . Food insecurity:    Worry: Not on file    Inability: Not on file  . Transportation needs:    Medical:  Not on file    Non-medical: Not on file  Tobacco Use  . Smoking status: Never Smoker  . Smokeless tobacco: Never Used  Substance and Sexual Activity  . Alcohol use: No  . Drug use: No  . Sexual activity: Not on file  Lifestyle  . Physical activity:    Days per week: Not on file    Minutes per session: Not on file  . Stress: Not on file  Relationships  . Social connections:    Talks on phone: Not on file    Gets together: Not on file    Attends religious service: Not on file    Active member of club or organization: Not on file    Attends meetings of clubs or organizations: Not on file    Relationship status: Not on file  . Intimate partner violence:    Fear of current or ex partner: Not on file    Emotionally abused: Not on file    Physically abused: Not on file    Forced sexual activity: Not on file  Other Topics Concern  . Not on file  Social History Narrative  . Not on file  :  Pertinent items are noted in HPI.  Exam: Blood pressure (!) 159/69, pulse 72, temperature 97.9 F (36.6 C), temperature source Oral, resp. rate 17, height 6\' 1"  (1.854 m), weight 208 lb 14.4 oz (94.8 kg), SpO2 100 %.  ECOG 0 General appearance: alert and cooperative appeared without distress. Head: atraumatic without any abnormalities. Eyes: conjunctivae/corneas clear. PERRL.  Sclera anicteric. Throat: lips, mucosa, and tongue normal; without oral thrush or ulcers. Resp: clear to auscultation bilaterally without rhonchi, wheezes or dullness to percussion. Cardio: regular rate and rhythm, S1, S2 normal, no murmur, click, rub or gallop GI: soft, non-tender; bowel sounds normal; no masses,  no organomegaly Skin: Skin color, texture, turgor normal. No rashes or lesions Lymph nodes: Cervical, supraclavicular, and axillary nodes normal. Neurologic: Grossly normal without any motor, sensory or deep tendon reflexes. Musculoskeletal: No joint deformity or effusion.  CBC    Component Value  Date/Time   WBC 7.6 05/20/2016 1034   RBC 5.03 05/20/2016 1034   HGB 15.6 05/20/2016 1034   HCT 45.4 05/20/2016 1034   PLT 181 05/20/2016 1034   MCV 90.3 05/20/2016 1034   MCH 31.0 05/20/2016 1034   MCHC 34.4 05/20/2016 1034   RDW 14.1 05/20/2016 1034   LYMPHSABS 1.4 05/20/2016 1034   MONOABS 0.7 05/20/2016 1034   EOSABS 0.1 05/20/2016 1034   BASOSABS 0.0 05/20/2016 1034     Chemistry      Component Value Date/Time   NA 139 05/20/2016 1034   NA 138 03/03/2012   K 4.2 05/20/2016 1034   CL 103 05/20/2016 1034  CO2 27 05/20/2016 1034   BUN 13 05/20/2016 1034   BUN 11 03/03/2012   CREATININE 1.10 06/24/2017 0759   GLU 95 03/03/2012      Component Value Date/Time   CALCIUM 9.4 05/20/2016 1034   ALKPHOS 70 05/20/2016 1034   AST 19 05/20/2016 1034   ALT 23 05/20/2016 1034   BILITOT 0.6 05/20/2016 1034       Assessment and Plan:   72 year old man with the following:  1.  Castration-sensitive prostate cancer with advanced disease that is low volume with potential sclerotic lesion at the T11 vertebral body.  He presented with a PSA of 32 and a Gleason score of 8.  His other staging work-up has been unrevealing and currently started on androgen deprivation with PSA is undetectable.  The natural course of this disease was reviewed today and treatment options were discussed.  He understands he has an incurable malignancy with disease that has spread beyond the prostate indicating oligometastatic disease.  The cornerstone of treating this disease would be androgen deprivation which he is already on and has been successful.  The role for additional therapy was discussed today which includes systemic chemotherapy, Zytiga and Xtandi which she did not tolerate.  Risks and benefits of adding these therapies at this time versus proceeding with androgen deprivation was discussed.  After discussion today, based on his wishes he wanted to proceed with androgen deprivation therapy alone and will  defer additional therapy if his PSA starts to rise.  He understands that will happen in the future and additional therapy will be needed.  2.  Local treatment: The role for radiation therapy to the prostate as well as his oligo metastatic lesion in T11 was reiterated today.  I highly encouraged him to consider this option but he is refusing at this time.  He would like to avoid radiation therapy at all costs at this time.  3.  Bone directed therapy: I agree with Dr. Gloriann Loan with continuing Xgeva at this time.  4.  Follow-up: I am happy to see him in the future as needed if his PSA starts to rise again and becomes castration-resistant and will discuss options of treatment at that time.  60  minutes was spent with the patient face-to-face today.  More than 50% of time was dedicated to doing his disease status, treatment options, complications related to therapy and answering questions regarding future plan of care.      Thank you for the referral. A copy of this consult has been forwarded to the requesting physician.

## 2017-12-31 ENCOUNTER — Telehealth: Payer: Self-pay

## 2017-12-31 NOTE — Telephone Encounter (Signed)
Per 1/25 no los °

## 2018-03-18 ENCOUNTER — Other Ambulatory Visit: Payer: Self-pay | Admitting: Internal Medicine

## 2018-03-18 ENCOUNTER — Ambulatory Visit
Admission: RE | Admit: 2018-03-18 | Discharge: 2018-03-18 | Disposition: A | Discharge status: Home / Self Care | Payer: Medicare Other | Source: Ambulatory Visit | Attending: Internal Medicine | Admitting: Internal Medicine

## 2018-03-18 DIAGNOSIS — R079 Chest pain, unspecified: Secondary | ICD-10-CM

## 2019-06-27 ENCOUNTER — Other Ambulatory Visit: Payer: Self-pay | Admitting: Urology

## 2019-06-27 ENCOUNTER — Other Ambulatory Visit (HOSPITAL_COMMUNITY): Payer: Self-pay | Admitting: Urology

## 2019-06-27 DIAGNOSIS — C7951 Secondary malignant neoplasm of bone: Secondary | ICD-10-CM

## 2019-06-27 DIAGNOSIS — C61 Malignant neoplasm of prostate: Secondary | ICD-10-CM

## 2019-07-20 ENCOUNTER — Ambulatory Visit
Admission: RE | Admit: 2019-07-20 | Discharge: 2019-07-20 | Disposition: A | Discharge status: Home / Self Care | Payer: Medicare Other | Source: Ambulatory Visit | Attending: Internal Medicine | Admitting: Internal Medicine

## 2019-07-20 ENCOUNTER — Other Ambulatory Visit: Payer: Self-pay | Admitting: Internal Medicine

## 2019-07-20 ENCOUNTER — Other Ambulatory Visit: Payer: Self-pay

## 2019-07-20 DIAGNOSIS — R1915 Other abnormal bowel sounds: Secondary | ICD-10-CM

## 2019-07-20 DIAGNOSIS — R109 Unspecified abdominal pain: Secondary | ICD-10-CM

## 2019-08-21 ENCOUNTER — Encounter (HOSPITAL_COMMUNITY)
Admission: RE | Admit: 2019-08-21 | Discharge: 2019-08-21 | Disposition: A | Discharge status: Home / Self Care | Payer: Medicare Other | Source: Ambulatory Visit | Attending: Urology | Admitting: Urology

## 2019-08-21 ENCOUNTER — Other Ambulatory Visit: Payer: Self-pay

## 2019-08-21 DIAGNOSIS — C61 Malignant neoplasm of prostate: Secondary | ICD-10-CM | POA: Insufficient documentation

## 2019-08-21 DIAGNOSIS — C7951 Secondary malignant neoplasm of bone: Secondary | ICD-10-CM | POA: Diagnosis present

## 2019-08-21 MED ORDER — TECHNETIUM TC 99M MEDRONATE IV KIT
20.0000 | PACK | Freq: Once | INTRAVENOUS | Status: AC | PRN
  Administered 2019-08-21: 11:00:00 20.8 via INTRAVENOUS

## 2019-12-26 ENCOUNTER — Other Ambulatory Visit: Payer: Self-pay | Admitting: Internal Medicine

## 2019-12-26 ENCOUNTER — Ambulatory Visit
Admission: RE | Admit: 2019-12-26 | Discharge: 2019-12-26 | Disposition: A | Discharge status: Home / Self Care | Payer: Medicare Other | Source: Ambulatory Visit | Attending: Internal Medicine | Admitting: Internal Medicine

## 2019-12-26 ENCOUNTER — Other Ambulatory Visit: Payer: Self-pay

## 2019-12-26 DIAGNOSIS — R0781 Pleurodynia: Secondary | ICD-10-CM

## 2020-05-08 IMAGING — NM NM BONE WHOLE BODY
2 series · 2 of 2 positions shown · non-contrast
Comparison: None

Correlation: CT abdomen and pelvis 06/09/2017

CLINICAL DATA: Prostate cancer

EXAM:
NUCLEAR MEDICINE WHOLE BODY BONE SCAN
TECHNIQUE: Whole body anterior and posterior images were obtained approximately
3 hours after intravenous injection of radiopharmaceutical.
RADIOPHARMACEUTICALS:  21.5 mCi Sechnetium-WWm MDP IV

[Series 1: whole body · 2.66mm/px · 1 of 1 slices shown (1 of 2)]
[im 1/1]
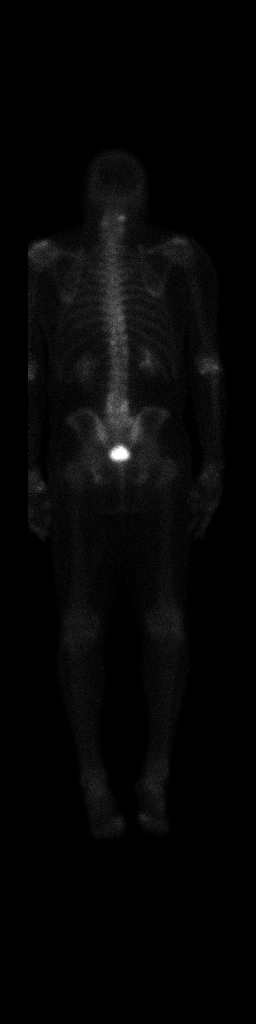

[Series 1: whole body · 2.66mm/px · 1 of 1 slices shown (2 of 2)]
[im 1/1]
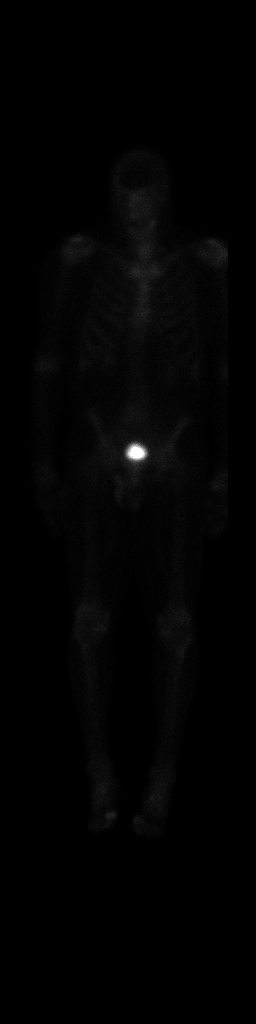

[2 of 2 positions shown; findings below may reference images not displayed]

FINDINGS: Uptake at the cervical spine, lumbar spine, shoulders, elbows,
wrists, knees, and feet, typically degenerative.

Minimal uptake at LEFT T12 and RIGHT T10, potentially degenerative;
patient has degenerative changes on the RIGHT at T9-T10 disc space,
likely accounting for the T10 finding.

No definite abnormality at LEFT T12 by CT.

Multilevel degenerative disc and facet disease changes of lower
lumbar spine.

Uptake LEFT mandible, nonspecific, can be seen with dental disease.

No additional sites of abnormal osseous tracer accumulation are
identified.

Expected urinary tract and soft tissue distribution of tracer.
IMPRESSION: Numerous sites of likely degenerative type uptake as above.

Nonspecific increased tracer localization at LEFT T12, could
potentially be degenerative but without definitive correlation on CT
exam; this could be assessed by MR imaging of the thoracic spine to
further assess for osseous metastatic disease at T12.

## 2020-11-11 ENCOUNTER — Other Ambulatory Visit: Payer: Self-pay

## 2020-11-11 ENCOUNTER — Ambulatory Visit
Admission: RE | Admit: 2020-11-11 | Discharge: 2020-11-11 | Disposition: A | Discharge status: Home / Self Care | Payer: Medicare Other | Source: Ambulatory Visit | Attending: Internal Medicine | Admitting: Internal Medicine

## 2020-11-11 ENCOUNTER — Other Ambulatory Visit: Payer: Self-pay | Admitting: Internal Medicine

## 2020-11-11 DIAGNOSIS — C61 Malignant neoplasm of prostate: Secondary | ICD-10-CM

## 2020-11-11 DIAGNOSIS — R079 Chest pain, unspecified: Secondary | ICD-10-CM

## 2021-06-19 ENCOUNTER — Other Ambulatory Visit (HOSPITAL_COMMUNITY): Payer: Self-pay | Admitting: Urology

## 2021-06-19 ENCOUNTER — Other Ambulatory Visit: Payer: Self-pay | Admitting: Urology

## 2021-06-19 DIAGNOSIS — C7951 Secondary malignant neoplasm of bone: Secondary | ICD-10-CM

## 2021-07-21 ENCOUNTER — Encounter (HOSPITAL_COMMUNITY)
Admission: RE | Admit: 2021-07-21 | Discharge: 2021-07-21 | Disposition: A | Discharge status: Home / Self Care | Payer: Medicare Other | Source: Ambulatory Visit | Attending: Urology | Admitting: Urology

## 2021-07-21 ENCOUNTER — Ambulatory Visit (HOSPITAL_COMMUNITY)
Admission: RE | Admit: 2021-07-21 | Discharge: 2021-07-21 | Disposition: A | Discharge status: Home / Self Care | Payer: Medicare Other | Source: Ambulatory Visit | Attending: Urology | Admitting: Urology

## 2021-07-21 DIAGNOSIS — C7951 Secondary malignant neoplasm of bone: Secondary | ICD-10-CM | POA: Diagnosis present

## 2021-07-21 DIAGNOSIS — C7952 Secondary malignant neoplasm of bone marrow: Secondary | ICD-10-CM | POA: Insufficient documentation

## 2021-07-21 MED ORDER — TECHNETIUM TC 99M MEDRONATE IV KIT
20.0000 | PACK | Freq: Once | INTRAVENOUS | Status: AC | PRN
  Administered 2021-07-21: 20.2 via INTRAVENOUS

## 2021-12-01 ENCOUNTER — Other Ambulatory Visit: Payer: Self-pay | Admitting: Urology

## 2021-12-09 ENCOUNTER — Encounter (HOSPITAL_BASED_OUTPATIENT_CLINIC_OR_DEPARTMENT_OTHER): Payer: Self-pay | Admitting: Urology

## 2021-12-09 NOTE — Progress Notes (Signed)
Spoke w/ via phone for pre-op interview--- Adam Mathis Lab needs dos---- EKG              Lab results------ COVID test -----patient states asymptomatic no test needed Arrive at -------1145 NPO after MN NO Solid Food.  Clear liquids from MN until---1045 Med rec completed Medications to take morning of surgery -----NONE Diabetic medication ----- Patient instructed no nail polish to be worn day of surgery Patient instructed to bring photo id and insurance card day of surgery Patient aware to have Driver (ride ) / caregiver  [name]  for 24 hours after surgery  Patient Special Instructions ----- Pre-Op special Istructions ----- Patient verbalized understanding of instructions that were given at this phone interview. Patient denies shortness of breath, chest pain, fever, cough at this phone interview.

## 2021-12-12 ENCOUNTER — Encounter (HOSPITAL_BASED_OUTPATIENT_CLINIC_OR_DEPARTMENT_OTHER)
Admission: RE | Disposition: A | Discharge status: Home / Self Care | Payer: Self-pay | Source: Home / Self Care | Attending: Urology

## 2021-12-12 ENCOUNTER — Ambulatory Visit (HOSPITAL_BASED_OUTPATIENT_CLINIC_OR_DEPARTMENT_OTHER): Payer: Medicare Other | Admitting: Anesthesiology

## 2021-12-12 ENCOUNTER — Encounter (HOSPITAL_BASED_OUTPATIENT_CLINIC_OR_DEPARTMENT_OTHER): Payer: Self-pay | Admitting: Urology

## 2021-12-12 ENCOUNTER — Other Ambulatory Visit: Payer: Self-pay

## 2021-12-12 ENCOUNTER — Ambulatory Visit (HOSPITAL_BASED_OUTPATIENT_CLINIC_OR_DEPARTMENT_OTHER)
Admission: RE | Admit: 2021-12-12 | Discharge: 2021-12-12 | Disposition: A | Discharge status: Home / Self Care | Payer: Medicare Other | Attending: Urology | Admitting: Urology

## 2021-12-12 DIAGNOSIS — I10 Essential (primary) hypertension: Secondary | ICD-10-CM | POA: Insufficient documentation

## 2021-12-12 DIAGNOSIS — N448 Other noninflammatory disorders of the testis: Secondary | ICD-10-CM | POA: Diagnosis not present

## 2021-12-12 DIAGNOSIS — N500 Atrophy of testis: Secondary | ICD-10-CM | POA: Insufficient documentation

## 2021-12-12 DIAGNOSIS — C61 Malignant neoplasm of prostate: Secondary | ICD-10-CM

## 2021-12-12 DIAGNOSIS — R351 Nocturia: Secondary | ICD-10-CM | POA: Diagnosis not present

## 2021-12-12 DIAGNOSIS — G4730 Sleep apnea, unspecified: Secondary | ICD-10-CM | POA: Insufficient documentation

## 2021-12-12 DIAGNOSIS — C799 Secondary malignant neoplasm of unspecified site: Secondary | ICD-10-CM | POA: Insufficient documentation

## 2021-12-12 DIAGNOSIS — Z01818 Encounter for other preprocedural examination: Secondary | ICD-10-CM

## 2021-12-12 HISTORY — PX: ORCHIECTOMY: SHX2116

## 2021-12-12 SURGERY — ORCHIECTOMY
Anesthesia: General | Site: Scrotum | Laterality: Bilateral

## 2021-12-12 MED ORDER — LACTATED RINGERS IV SOLN
INTRAVENOUS | Status: DC
Start: 2021-12-12 — End: 2021-12-12

## 2021-12-12 MED ORDER — BUPIVACAINE HCL (PF) 0.25 % IJ SOLN
INTRAMUSCULAR | Status: DC | PRN
  Administered 2021-12-12: 20 mL

## 2021-12-12 MED ORDER — CEFAZOLIN SODIUM-DEXTROSE 2-4 GM/100ML-% IV SOLN
INTRAVENOUS | Status: AC
  Filled 2021-12-12: qty 100

## 2021-12-12 MED ORDER — MIDAZOLAM HCL 2 MG/2ML IJ SOLN
INTRAMUSCULAR | Status: AC
  Filled 2021-12-12: qty 2

## 2021-12-12 MED ORDER — LIDOCAINE HCL (CARDIAC) PF 100 MG/5ML IV SOSY
PREFILLED_SYRINGE | INTRAVENOUS | Status: DC | PRN
  Administered 2021-12-12: 50 mg via INTRAVENOUS

## 2021-12-12 MED ORDER — FENTANYL CITRATE (PF) 100 MCG/2ML IJ SOLN
INTRAMUSCULAR | Status: DC | PRN
  Administered 2021-12-12 (×2): 50 ug via INTRAVENOUS

## 2021-12-12 MED ORDER — DEXAMETHASONE SODIUM PHOSPHATE 10 MG/ML IJ SOLN
INTRAMUSCULAR | Status: AC
  Filled 2021-12-12: qty 1

## 2021-12-12 MED ORDER — ONDANSETRON HCL 4 MG/2ML IJ SOLN
INTRAMUSCULAR | Status: AC
  Filled 2021-12-12: qty 2

## 2021-12-12 MED ORDER — ONDANSETRON HCL 4 MG/2ML IJ SOLN
INTRAMUSCULAR | Status: DC | PRN
  Administered 2021-12-12: 4 mg via INTRAVENOUS

## 2021-12-12 MED ORDER — CEFAZOLIN SODIUM-DEXTROSE 2-4 GM/100ML-% IV SOLN
2.0000 g | INTRAVENOUS | Status: AC
  Administered 2021-12-12: 2 g via INTRAVENOUS

## 2021-12-12 MED ORDER — MIDAZOLAM HCL 5 MG/5ML IJ SOLN
INTRAMUSCULAR | Status: DC | PRN
  Administered 2021-12-12: 1 mg via INTRAVENOUS

## 2021-12-12 MED ORDER — 0.9 % SODIUM CHLORIDE (POUR BTL) OPTIME
TOPICAL | Status: DC | PRN
  Administered 2021-12-12: 500 mL

## 2021-12-12 MED ORDER — PROPOFOL 10 MG/ML IV BOLUS
INTRAVENOUS | Status: AC
  Filled 2021-12-12: qty 20

## 2021-12-12 MED ORDER — SENNOSIDES-DOCUSATE SODIUM 8.6-50 MG PO TABS: 1.0000 | ORAL_TABLET | Freq: Two times a day (BID) | ORAL | 0 refills | Status: DC

## 2021-12-12 MED ORDER — FENTANYL CITRATE (PF) 100 MCG/2ML IJ SOLN
INTRAMUSCULAR | Status: AC
  Filled 2021-12-12: qty 2

## 2021-12-12 MED ORDER — DEXAMETHASONE SODIUM PHOSPHATE 4 MG/ML IJ SOLN
INTRAMUSCULAR | Status: DC | PRN
  Administered 2021-12-12: 5 mg via INTRAVENOUS

## 2021-12-12 MED ORDER — FENTANYL CITRATE (PF) 100 MCG/2ML IJ SOLN: 25.0000 ug | INTRAMUSCULAR | Status: DC | PRN

## 2021-12-12 MED ORDER — LIDOCAINE HCL (PF) 2 % IJ SOLN
INTRAMUSCULAR | Status: AC
  Filled 2021-12-12: qty 5

## 2021-12-12 MED ORDER — OXYCODONE-ACETAMINOPHEN 5-325 MG PO TABS: 1.0000 | ORAL_TABLET | Freq: Four times a day (QID) | ORAL | 0 refills | Status: AC | PRN

## 2021-12-12 MED ORDER — PROPOFOL 10 MG/ML IV BOLUS
INTRAVENOUS | Status: DC | PRN
  Administered 2021-12-12: 120 mg via INTRAVENOUS
  Administered 2021-12-12: 50 mg via INTRAVENOUS

## 2021-12-12 SURGICAL SUPPLY — 40 items
ADH SKN CLS APL DERMABOND .7 (GAUZE/BANDAGES/DRESSINGS)
BLADE CLIPPER SENSICLIP SURGIC (BLADE) IMPLANT
BLADE SURG 15 STRL LF DISP TIS (BLADE) ×1 IMPLANT
BLADE SURG 15 STRL SS (BLADE) ×1
BNDG GAUZE DERMACEA FLUFF 4 (GAUZE/BANDAGES/DRESSINGS) IMPLANT
BNDG GZE DERMACEA 4 6PLY (GAUZE/BANDAGES/DRESSINGS) ×1
COVER BACK TABLE 60X90IN (DRAPES) ×1 IMPLANT
COVER MAYO STAND STRL (DRAPES) ×1 IMPLANT
COVER PROBE U/S 5X48 (MISCELLANEOUS) IMPLANT
DERMABOND ADVANCED .7 DNX12 (GAUZE/BANDAGES/DRESSINGS) IMPLANT
DISSECTOR ROUND CHERRY 3/8 STR (MISCELLANEOUS) IMPLANT
DRAIN PENROSE 0.5X18 (DRAIN) ×1 IMPLANT
DRAPE LAPAROTOMY 100X72 PEDS (DRAPES) ×1 IMPLANT
ELECT NDL TIP 2.8 STRL (NEEDLE) ×1 IMPLANT
ELECT NEEDLE TIP 2.8 STRL (NEEDLE) ×1 IMPLANT
ELECT REM PT RETURN 9FT ADLT (ELECTROSURGICAL) ×1
ELECTRODE REM PT RTRN 9FT ADLT (ELECTROSURGICAL) ×1 IMPLANT
GAUZE 4X4 16PLY ~~LOC~~+RFID DBL (SPONGE) ×1 IMPLANT
GAUZE PAD ABD 8X10 STRL (GAUZE/BANDAGES/DRESSINGS) IMPLANT
GLOVE BIO SURGEON STRL SZ7.5 (GLOVE) ×1 IMPLANT
GOWN STRL REUS W/TWL LRG LVL3 (GOWN DISPOSABLE) ×2 IMPLANT
KIT TURNOVER CYSTO (KITS) ×1 IMPLANT
NDL HYPO 25X1 1.5 SAFETY (NEEDLE) ×1 IMPLANT
NEEDLE HYPO 25X1 1.5 SAFETY (NEEDLE) ×1 IMPLANT
NS IRRIG 500ML POUR BTL (IV SOLUTION) IMPLANT
PACK BASIN DAY SURGERY FS (CUSTOM PROCEDURE TRAY) ×1 IMPLANT
PENCIL SMOKE EVACUATOR (MISCELLANEOUS) ×1 IMPLANT
SUPPORT SCROTAL LG STRP (MISCELLANEOUS) ×1 IMPLANT
SUT ETHILON 3 0 PS 1 (SUTURE) IMPLANT
SUT MNCRL AB 4-0 PS2 18 (SUTURE) ×1 IMPLANT
SUT SILK 0 TIES 10X30 (SUTURE) IMPLANT
SUT VIC AB 3-0 SH 27 (SUTURE) ×1
SUT VIC AB 3-0 SH 27X BRD (SUTURE) ×1 IMPLANT
SYR 30ML LL (SYRINGE) IMPLANT
SYR CONTROL 10ML LL (SYRINGE) ×1 IMPLANT
TOWEL OR 17X26 10 PK STRL BLUE (TOWEL DISPOSABLE) ×2 IMPLANT
TRAY DSU PREP LF (CUSTOM PROCEDURE TRAY) ×1 IMPLANT
TUBE CONNECTING 12X1/4 (SUCTIONS) ×1 IMPLANT
WATER STERILE IRR 500ML POUR (IV SOLUTION) IMPLANT
YANKAUER SUCT BULB TIP NO VENT (SUCTIONS) ×1 IMPLANT

## 2021-12-12 NOTE — Anesthesia Postprocedure Evaluation (Signed)
Anesthesia Post Note  Patient: Adam Mathis  Procedure(s) Performed: ORCHIECTOMY (Bilateral: Scrotum)     Patient location during evaluation: PACU Anesthesia Type: General Level of consciousness: awake and alert Pain management: pain level controlled Vital Signs Assessment: post-procedure vital signs reviewed and stable Respiratory status: spontaneous breathing, nonlabored ventilation, respiratory function stable and patient connected to nasal cannula oxygen Cardiovascular status: blood pressure returned to baseline and stable Postop Assessment: no apparent nausea or vomiting Anesthetic complications: no   No notable events documented.  Last Vitals:  Vitals:   12/12/21 1400 12/12/21 1415  BP: (!) 153/74 (!) 164/79  Pulse: 64 61  Resp: 11 12  Temp: 36.6 C   SpO2: 97% 100%    Last Pain:  Vitals:   12/12/21 1430  TempSrc:   PainSc: 0-No pain                 Effie Berkshire

## 2021-12-12 NOTE — Discharge Instructions (Addendum)
1 - You may have some bloody discharge from are of drain / incisions on / off for up to a week. This is normal. OK to shower at anytime. No tub-bathing x 2 weeks.   2 - Call MD or go to ER for fever >102, severe pain / nausea / vomiting not relieved by medications, or acute change in medical status        Post Anesthesia Home Care Instructions  Activity: Get plenty of rest for the remainder of the day. A responsible individual must stay with you for 24 hours following the procedure.  For the next 24 hours, DO NOT: -Drive a car -Paediatric nurse -Drink alcoholic beverages -Take any medication unless instructed by your physician -Make any legal decisions or sign important papers.  Meals: Start with liquid foods such as gelatin or soup. Progress to regular foods as tolerated. Avoid greasy, spicy, heavy foods. If nausea and/or vomiting occur, drink only clear liquids until the nausea and/or vomiting subsides. Call your physician if vomiting continues.  Special Instructions/Symptoms: Your throat may feel dry or sore from the anesthesia or the breathing tube placed in your throat during surgery. If this causes discomfort, gargle with warm salt water. The discomfort should disappear within 24 hours.

## 2021-12-12 NOTE — Transfer of Care (Signed)
Immediate Anesthesia Transfer of Care Note  Patient: Adam Mathis  Procedure(s) Performed: ORCHIECTOMY (Bilateral: Scrotum)  Patient Location: PACU  Anesthesia Type:General  Level of Consciousness: awake, alert , and oriented  Airway & Oxygen Therapy: Patient Spontanous Breathing and Patient connected to face mask oxygen  Post-op Assessment: Report given to RN and Post -op Vital signs reviewed and stable  Post vital signs: Reviewed and stable  Last Vitals:  Vitals Value Taken Time  BP 161/86 12/12/21 1353  Temp    Pulse 65 12/12/21 1356  Resp 12 12/12/21 1356  SpO2 100 % 12/12/21 1356  Vitals shown include unvalidated device data.  Last Pain:  Vitals:   12/12/21 1159  TempSrc: Oral  PainSc: 0-No pain      Patients Stated Pain Goal: 4 (21/03/12 8118)  Complications: No notable events documented.

## 2021-12-12 NOTE — Brief Op Note (Signed)
12/12/2021  1:41 PM  PATIENT:  Adam Mathis  76 y.o. male  PRE-OPERATIVE DIAGNOSIS:  METASTATIC PROSTATE CANCER  POST-OPERATIVE DIAGNOSIS:  METASTATIC PROSTATE CANCER  PROCEDURE:  Procedure(s) with comments: ORCHIECTOMY (Bilateral) - 1 HR  SURGEON:  Surgeon(s) and Role:    * Alexis Frock, MD - Primary  PHYSICIAN ASSISTANT:   ASSISTANTS: none   ANESTHESIA:   local and general  EBL:  minimal   BLOOD ADMINISTERED:none  DRAINS: Penrose drain in the dependant scrotum    LOCAL MEDICATIONS USED:  MARCAINE     SPECIMEN:  Source of Specimen:  bilateral testes - gross only  DISPOSITION OF SPECIMEN:  PATHOLOGY  COUNTS:  YES  TOURNIQUET:  * No tourniquets in log *  DICTATION: .Other Dictation: Dictation Number [id number]  PLAN OF CARE: Discharge to home after PACU  PATIENT DISPOSITION:  PACU - hemodynamically stable.   Delay start of Pharmacological VTE agent (>24hrs) due to surgical blood loss or risk of bleeding: yes

## 2021-12-12 NOTE — Anesthesia Procedure Notes (Signed)
Procedure Name: LMA Insertion Date/Time: 12/12/2021 1:06 PM  Performed by: Bufford Spikes, CRNAPre-anesthesia Checklist: Patient identified, Emergency Drugs available, Suction available and Patient being monitored Patient Re-evaluated:Patient Re-evaluated prior to induction Oxygen Delivery Method: Circle system utilized Preoxygenation: Pre-oxygenation with 100% oxygen Induction Type: IV induction Ventilation: Mask ventilation without difficulty LMA: LMA inserted LMA Size: 4.0 Number of attempts: 1 Placement Confirmation: positive ETCO2 Tube secured with: Tape Dental Injury: Teeth and Oropharynx as per pre-operative assessment

## 2021-12-12 NOTE — Op Note (Signed)
NAME: Adam Mathis, CLASBY MEDICAL RECORD NO: [id number] ACCOUNT NO: [id number] DATE OF BIRTH: [birth year] FACILITY: [facility] LOCATION: [facility] PHYSICIAN: Alexis Frock, MD  Operative Report   DATE OF PROCEDURE: 12/12/2021  PREOPERATIVE DIAGNOSIS:  Metastatic prostate cancer.  PROCEDURE:  Bilateral simple orchiectomy.  ESTIMATED BLOOD LOSS:  Nil.  COMPLICATIONS:  None.  SPECIMEN:  Bilateral testes for gross only.  FINDINGS:  Unremarkable scrotum.  Somewhat atrophic bilateral testes consistent with prior [facility] therapy.  DRAINS:  1.  Penrose drain dependent scrotum.  INDICATIONS:  Adam Mathis is a very pleasant 76 year old man with history of metastatic adenocarcinoma of the prostate.  He has been managed with androgen deprivation for some time, which he has had a good but incomplete biochemical response to. His [facility]  therapy is quite expensive for him resulting in significant out of pocket cost and he is of a very limited means and this drastically inhibits his quality of life and creates a significant financial strain.  We discussed additional options for androgen  deprivation including surgical orchiectomy with the latter certainly being the most cost effective in the long term and given the permanence of his need for androgen deprivation, clearly warrants consideration, he wished to proceed with this.  Informed  consent was obtained and placed in medical record.  PROCEDURE IN DETAIL:  The patient being Adam Mathis verified and the procedure being bilateral simple orchiectomy was confirmed.  Procedure timeout was performed.  Intravenous antibiotics were administered.  General anesthesia was induced.  The patient  was placed into a supine position.  Sterile field was created prepping the patient's penis, perineum, scrotum and proximal thighs using iodine.  Next, an incision was made along the median raphe approximately 3 cm in length, directly on to the area of  his right testis.  Dartos was  dissected as were the tunics and right testis was delivered into the operative field.  Its attachments were taken down such that it was only held by the cord structures alone.  The testis was somewhat atrophic, but otherwise  unremarkable as expected.  Cord was divided into two segments, the proximal silk tie was placed and then two additional silk ties, one on each segment, which were then transected resulting in excellent hemostatic control of the cord.  The right testis  was set aside for gross examination only. Via the same median raphe incision, the left hemiscrotal compartment was entered.  Similarly, left testis was delivered into the operative field.  The testis attachments was taken down and again looking at this,  it was grossly unremarkable other than some expected atrophy.  The left cord was divided in 2 segments, the proximal area ligated with silk tie and each segment also ligated with silk tie.  This provided an excellent hemostatic control of the cord, which  was then transected and the left testis was passed away again for gross only examination.  A small counter incision was made into the deep in the scrotum through which 1/4-inch Penrose drain was placed with a nylon suture to this.  The operative field  was once again inspected. The cord stump site again appeared to be completely hemostatic.  Dartos was reapproximated using running Vicryl.  Skin was reapproximated using running Monocryl.  The skin incisions and drain sites were infiltrated with dilute  Marcaine.  A dressing of ABD pad, followed by fluffs and mesh underwear was placed and the procedure was terminated.  The patient tolerated procedure well, no immediate perioperative complications.  The patient was  taken to the postanesthesia care unit  in stable condition.  Plan for discharge home.   MUK D: 12/12/2021 1:47:24 pm T: 12/12/2021 8:58:00 pm  JOB: [id number]/ [id number]

## 2021-12-12 NOTE — H&P (Signed)
Adam Mathis is an 76 y.o. male.    Chief Complaint: Pre-Op BILATERAL orchiectomy  HPI:   1 -  Metastatic Prostate Cancer-  Initial Diagnosis - 12/12 cores Grade 4 disease 2019  Primary Therapy - none / androgen deprivation only  Prior Treatments - Lupron 2019  Most Recent Restaging - 06/2021 CT, BS - stable T11 mets  Current Treatment - androgen deprivation (36modue to advocacy program)   Recent Course:  09/2018 PSA 1.47 / T 616, path to restart hormones; 12/2018 PSA 0.25 / T < 10  08/2020 PSA 2.76, T <10, CMP Nl ==>Eligard 2.6 + XGeva, 11/2020 PSA 3.74, T 15, CMP Nl ==>Eligard 22.5  03/2021 PSA 2.92, T <12, CMP Nl ==> Eligared 22.6 + XGeva; 07/2021 PSA 2, T 10, CMP Nl ==> Eligard 22.5; 11/2021 PSA 3.37, T 17, CMP Nl   2 - Lower Urinary Tract Symptoms - on finasteride x years prior to androgen deprivation. Nocturia x 1 (better than average for age) and minimal bother on therapy.   PMH sig for mid HTN. NO ischemic CV disease / blood thinners, cateract surgery. He is retired from Adam Mathis heavy Adam Mathis His PCP is Adam Mathis.   Today " Adam Mathis" is seen to proceed with bilateral simple orchiectomy for permanent androgen deprivation in setting of metastatic prostate cancer. This is for long-term cost savings as he has very difficult time affording agents for prolonged chemical castration.   Past Medical History:  Diagnosis Date   Degenerative joint disease    Hypertension     Past Surgical History:  Procedure Laterality Date   CARPAL TUNNEL RELEASE  1990   GLAUCOMA SURGERY Left    TONSILLECTOMY      History reviewed. No pertinent family history. Social History:  reports that he has never smoked. He has never used smokeless tobacco. He reports that he does not drink alcohol and does not use drugs.  Allergies:  Allergies  Allergen Reactions   Penicillins Rash    No medications prior to admission.    No results found for this or any previous visit (from  the past 48 hour(s)). No results found.  Review of Systems  Constitutional:  Negative for chills and fever.  All other systems reviewed and are negative.   Height '5\' 9"'$  (1.753 m), weight 90.3 kg. Physical Exam Vitals reviewed.  Constitutional:      Comments: Extremely pleasant, at baseline.   HENT:     Head: Normocephalic.     Nose: Nose normal.  Eyes:     Pupils: Pupils are equal, round, and reactive to light.  Cardiovascular:     Rate and Rhythm: Normal rate.  Pulmonary:     Effort: Pulmonary effort is normal.  Abdominal:     General: Abdomen is flat.  Genitourinary:    Comments: No CVAT at present Musculoskeletal:        General: Normal range of motion.     Cervical back: Normal range of motion.  Skin:    General: Skin is warm.  Neurological:     General: No focal deficit present.     Mental Status: He is alert.  Psychiatric:        Mood and Affect: Mood normal.      Assessment/Plan  Proceed as planned with bilateral simple orchiectomy. RIsks, benefits, alternatives, exepcted peri-op course discussed previously and reiterated today. He understands the permanence of procedure.   TAlexis Frock MD 12/12/2021, 8:10 AM

## 2021-12-12 NOTE — Anesthesia Preprocedure Evaluation (Addendum)
Anesthesia Evaluation  Patient identified by MRN, date of birth, ID band Patient awake    Reviewed: Allergy & Precautions, NPO status , Patient's Chart, lab work & pertinent test results  Airway Mallampati: II  TM Distance: >3 FB Neck ROM: Full    Dental  (+) Upper Dentures   Pulmonary sleep apnea    breath sounds clear to auscultation       Cardiovascular hypertension,  Rhythm:Regular Rate:Normal     Neuro/Psych negative neurological ROS  negative psych ROS   GI/Hepatic negative GI ROS, Neg liver ROS,,,  Endo/Other  negative endocrine ROS    Renal/GU negative Renal ROS     Musculoskeletal  (+) Arthritis ,    Abdominal   Peds  Hematology negative hematology ROS (+)   Anesthesia Other Findings   Reproductive/Obstetrics                             Anesthesia Physical Anesthesia Plan  ASA: 2  Anesthesia Plan: General   Post-op Pain Management:    Induction: Intravenous  PONV Risk Score and Plan: 3 and Ondansetron, Dexamethasone and Midazolam  Airway Management Planned: LMA  Additional Equipment: None  Intra-op Plan:   Post-operative Plan: Extubation in OR  Informed Consent: I have reviewed the patients History and Physical, chart, labs and discussed the procedure including the risks, benefits and alternatives for the proposed anesthesia with the patient or authorized representative who has indicated his/her understanding and acceptance.     Dental advisory given  Plan Discussed with: CRNA  Anesthesia Plan Comments:        Anesthesia Quick Evaluation

## 2021-12-15 ENCOUNTER — Encounter (HOSPITAL_BASED_OUTPATIENT_CLINIC_OR_DEPARTMENT_OTHER): Payer: Self-pay | Admitting: Urology

## 2021-12-16 LAB — SURGICAL PATHOLOGY

## 2022-08-30 ENCOUNTER — Encounter (HOSPITAL_BASED_OUTPATIENT_CLINIC_OR_DEPARTMENT_OTHER): Payer: Self-pay

## 2022-08-30 ENCOUNTER — Emergency Department (HOSPITAL_BASED_OUTPATIENT_CLINIC_OR_DEPARTMENT_OTHER): Payer: Medicare Other

## 2022-08-30 ENCOUNTER — Emergency Department (HOSPITAL_BASED_OUTPATIENT_CLINIC_OR_DEPARTMENT_OTHER)
Admission: EM | Admit: 2022-08-30 | Discharge: 2022-08-30 | Disposition: A | Discharge status: Home / Self Care | Payer: Medicare Other | Attending: Emergency Medicine | Admitting: Emergency Medicine

## 2022-08-30 ENCOUNTER — Other Ambulatory Visit: Payer: Self-pay

## 2022-08-30 DIAGNOSIS — Z7982 Long term (current) use of aspirin: Secondary | ICD-10-CM | POA: Insufficient documentation

## 2022-08-30 DIAGNOSIS — R42 Dizziness and giddiness: Secondary | ICD-10-CM

## 2022-08-30 HISTORY — DX: Vertigo of central origin: H81.4

## 2022-08-30 LAB — CBC WITH DIFFERENTIAL/PLATELET
Abs Immature Granulocytes: 0.02 10*3/uL (ref 0.00–0.07)
Basophils Absolute: 0 10*3/uL (ref 0.0–0.1)
Basophils Relative: 0 %
Eosinophils Absolute: 0.2 10*3/uL (ref 0.0–0.5)
Eosinophils Relative: 3 %
HCT: 38 % — ABNORMAL LOW (ref 39.0–52.0)
Hemoglobin: 12.6 g/dL — ABNORMAL LOW (ref 13.0–17.0)
Immature Granulocytes: 0 %
Lymphocytes Relative: 35 %
Lymphs Abs: 2 10*3/uL (ref 0.7–4.0)
MCH: 29.7 pg (ref 26.0–34.0)
MCHC: 33.2 g/dL (ref 30.0–36.0)
MCV: 89.6 fL (ref 80.0–100.0)
Monocytes Absolute: 0.5 10*3/uL (ref 0.1–1.0)
Monocytes Relative: 9 %
Neutro Abs: 2.9 10*3/uL (ref 1.7–7.7)
Neutrophils Relative %: 53 %
Platelets: 190 10*3/uL (ref 150–400)
RBC: 4.24 MIL/uL (ref 4.22–5.81)
RDW: 14.7 % (ref 11.5–15.5)
WBC: 5.6 10*3/uL (ref 4.0–10.5)
nRBC: 0 % (ref 0.0–0.2)

## 2022-08-30 LAB — TROPONIN I (HIGH SENSITIVITY): Troponin I (High Sensitivity): 7 ng/L (ref <18)

## 2022-08-30 LAB — COMPREHENSIVE METABOLIC PANEL
ALT: 26 U/L (ref 0–44)
AST: 17 U/L (ref 15–41)
Albumin: 4.4 g/dL (ref 3.5–5.0)
Alkaline Phosphatase: 74 U/L (ref 38–126)
Anion gap: 8 (ref 5–15)
BUN: 18 mg/dL (ref 8–23)
CO2: 26 mmol/L (ref 22–32)
Calcium: 9.4 mg/dL (ref 8.9–10.3)
Chloride: 107 mmol/L (ref 98–111)
Creatinine, Ser: 1.16 mg/dL (ref 0.61–1.24)
GFR, Estimated: >60 mL/min (ref >60)
Glucose, Bld: 112 mg/dL — ABNORMAL HIGH (ref 70–99)
Potassium: 3.9 mmol/L (ref 3.5–5.1)
Sodium: 141 mmol/L (ref 135–145)
Total Bilirubin: 0.4 mg/dL (ref 0.3–1.2)
Total Protein: 7.3 g/dL (ref 6.5–8.1)

## 2022-08-30 LAB — URINALYSIS, ROUTINE W REFLEX MICROSCOPIC
Bilirubin Urine: NEGATIVE
Glucose, UA: NEGATIVE mg/dL
Hgb urine dipstick: NEGATIVE
Ketones, ur: NEGATIVE mg/dL
Leukocytes,Ua: NEGATIVE
Nitrite: NEGATIVE
Protein, ur: NEGATIVE mg/dL
Specific Gravity, Urine: >=1.03 (ref 1.005–1.030)
pH: 5.5 (ref 5.0–8.0)

## 2022-08-30 LAB — CBG MONITORING, ED: Glucose-Capillary: 107 mg/dL — ABNORMAL HIGH (ref 70–99)

## 2022-08-30 MED ORDER — MECLIZINE HCL 25 MG PO TABS
25.0000 mg | ORAL_TABLET | Freq: Once | ORAL | Status: AC
  Administered 2022-08-30: 25 mg via ORAL
  Filled 2022-08-30: qty 1

## 2022-08-30 NOTE — ED Triage Notes (Addendum)
Patient has had episodes where his vision get blurry and then has dizziness like the room is spinning since yesterday at 11:30. He denied weakness, numbness or tingling. He denied chest pain or shortness of breath and no cough. He denied fever.He stated he is on antibiotics for a UTI at this time.

## 2022-08-30 NOTE — Discharge Instructions (Signed)
Take meclizine as needed for dizziness.  Follow-up with your eye doctor and primary care doctor.  Please return if symptoms worsen.

## 2022-08-30 NOTE — ED Provider Notes (Signed)
[facility] Provider Note   CSN: [id number] Arrival date & time: 08/30/22  1339     History  Chief Complaint  Patient presents with   Dizziness    Adam Mathis is a 77 y.o. male.  Patient here with dizziness.  History of vertigo.  Not as intense as he had vertigo in the past he has been able to ambulate but will get dizzy at times.  He will get blurred vision at time.  He denies any weakness numbness tingling.  Denies any chest pain or shortness of breath or cough or sputum production.  He is not having any eye discomfort.  He is not having any fever or chills.  No neck pain.  No falls.  The history is provided by the patient.       Home Medications Prior to Admission medications   Medication Sig Start Date End Date Taking? Authorizing Provider  aspirin EC 81 MG tablet Take 81 mg by mouth daily. Swallow whole.    [provider]  doxazosin (CARDURA) 4 MG tablet Take 4 mg by mouth at bedtime.    [provider]  finasteride (PROSCAR) 5 MG tablet Take 5 mg by mouth daily.    [provider]  loratadine (CLARITIN) 10 MG tablet Take 10 mg by mouth daily. 11/18/17   [provider]  meclizine (ANTIVERT) 12.5 MG tablet Take 1 tablet (12.5 mg total) by mouth 3 (three) times daily as needed for dizziness. 05/20/16   Long, Arlyss Repress, MD  ondansetron (ZOFRAN) 4 MG tablet Take 1 tablet (4 mg total) by mouth every 6 (six) hours. 05/20/16   Long, Arlyss Repress, MD  oxyCODONE-acetaminophen (PERCOCET) 5-325 MG tablet Take 1 tablet by mouth every 6 (six) hours as needed for severe pain or moderate pain (post-operatively). 12/12/21 12/12/22  Loletta Parish., MD  PRESCRIPTION MEDICATION Eye drops for Glaucoma    [provider]  rosuvastatin (CRESTOR) 5 MG tablet Take 5 mg by mouth daily.    [provider]  senna-docusate (SENOKOT-S) 8.6-50 MG tablet Take 1 tablet by mouth 2 (two) times daily. While  taking strong pain meds to prevent constipation 12/12/21   Manny, Delbert Phenix., MD  timolol (TIMOPTIC) 0.5 % ophthalmic solution PLACE 1 DROP INTO THE RIGHT EYE 2 TIMES DAILY. 10/01/17   [provider]  XTANDI 40 MG capsule  10/18/17   [provider]      Allergies    Brimonidine and Penicillins    Review of Systems   Review of Systems  Physical Exam Updated Vital Signs BP 138/85 (BP Location: Left Arm)   Pulse (!) 59   Temp 98 F (36.7 C) (Oral)   Resp 16   Ht 5\' 9"  (1.753 m)   SpO2 100%   BMI 26.29 kg/m  Physical Exam Vitals and nursing note reviewed.  Constitutional:      General: He is not in acute distress.    Appearance: He is well-developed. He is not ill-appearing.  HENT:     Head: Normocephalic and atraumatic.     Nose: Nose normal.     Mouth/Throat:     Mouth: Mucous membranes are moist.  Eyes:     Extraocular Movements: Extraocular movements intact.     Conjunctiva/sclera: Conjunctivae normal.     Pupils: Pupils are equal, round, and reactive to light.  Cardiovascular:     Rate and Rhythm: Normal rate and regular rhythm.  Pulses: Normal pulses.     Heart sounds: Normal heart sounds. No murmur heard. Pulmonary:     Effort: Pulmonary effort is normal. No respiratory distress.     Breath sounds: Normal breath sounds.  Abdominal:     Palpations: Abdomen is soft.     Tenderness: There is no abdominal tenderness.  Musculoskeletal:        General: No swelling.     Cervical back: Normal range of motion and neck supple.  Skin:    General: Skin is warm and dry.     Capillary Refill: Capillary refill takes less than 2 seconds.  Neurological:     General: No focal deficit present.     Mental Status: He is alert and oriented to person, place, and time.     Cranial Nerves: No cranial nerve deficit.     Sensory: No sensory deficit.     Motor: No weakness.     Coordination: Coordination normal.     Comments: 5+ out of 5 strength all, normal  sensation, no drift, normal finger-nose-finger, normal speech, normal visual fields, pretty good ambulation but is symptomatic  Psychiatric:        Mood and Affect: Mood normal.     ED Results / Procedures / Treatments   Labs (all labs ordered are listed, but only abnormal results are displayed) Labs Reviewed  CBC WITH DIFFERENTIAL/PLATELET - Abnormal; Notable for the following components:      Result Value   Hemoglobin 12.6 (*)    HCT 38.0 (*)    All other components within normal limits  COMPREHENSIVE METABOLIC PANEL - Abnormal; Notable for the following components:   Glucose, Bld 112 (*)    All other components within normal limits  CBG MONITORING, ED - Abnormal; Notable for the following components:   Glucose-Capillary 107 (*)    All other components within normal limits  URINALYSIS, ROUTINE W REFLEX MICROSCOPIC  TROPONIN I (HIGH SENSITIVITY)    EKG EKG Interpretation Date/Time:  Sunday August 30 2022 14:06:47 EDT Ventricular Rate:  59 PR Interval:  169 QRS Duration:  98 QT Interval:  447 QTC Calculation: 443 R Axis:   73  Text Interpretation: Sinus rhythm  st changes seen on prior Confirmed by Virgina Norfolk [phone]) on 08/30/2022 3:01:14 PM  Radiology MR BRAIN WO CONTRAST  Result Date: 08/30/2022 CLINICAL DATA:  Syncope/presyncope, cerebrovascular cause suspected EXAM: MRI HEAD WITHOUT CONTRAST TECHNIQUE: Multiplanar, multiecho pulse sequences of the brain and surrounding structures were obtained without intravenous contrast. COMPARISON:  None Available. FINDINGS: Brain: Negative for an acute infarct. No hemorrhage. No hydrocephalus. No extra-axial fluid collection. 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 No mass effect. No mass lesion. Vascular: Normal flow voids. Skull and upper cervical spine: Normal marrow signal. Sinuses/Orbits: No middle ear or mastoid effusion. Paranasal sinuses are clear.  Bilateral lens replacement. Orbits are otherwise unremarkable. Other: None. IMPRESSION: No specific etiology for dizziness identified. Electronically Signed   By: Lorenza Cambridge M.D.   On: 08/30/2022 16:09    Procedures Procedures    Medications Ordered in ED Medications  meclizine (ANTIVERT) tablet 25 mg (25 mg Oral Given 08/30/22 1544)    ED Course/ Medical Decision Making/ A&P                                 Medical Decision Making Amount and/or Complexity of  Data Reviewed Labs: ordered.   Humberto Leep Apollo is here with dizziness.  Normal vitals.  No fever.  EKG shows sinus rhythm.  No ischemic changes.  ST changes are seen on prior EKGs.  He is already had blood work done prior to my evaluation.  Differential diagnosis likely peripheral vertigo versus less likely stroke or dehydration or electrolyte abnormality.  He is already had CBC BMP and troponin which were unremarkable per my review and interpretation.  Is not having any chest pain.  Doubt any cardiac process.  Doubt any infectious process.  I will give a dose of meclizine and get an MRI of the brain to rule out stroke.  His eye exam is overall unremarkable.  MRI is negative for stroke.  Patient is feeling better.  Overall suspect peripheral vertigo.  Has prescription for meclizine at home already.  Lab work otherwise unremarkable.  No significant anemia, electrolyte abnormality kidney injury or leukocytosis.  Discharged in good condition.  Understands return precautions.  This chart was dictated using voice recognition software.  Despite best efforts to proofread,  errors can occur which can change the documentation meaning.         Final Clinical Impression(s) / ED Diagnoses Final diagnoses:  Dizziness  Vertigo    Rx / DC Orders ED Discharge Orders     None         Virgina Norfolk, DO 08/30/22 1614

## 2022-08-30 NOTE — ED Notes (Signed)
Patient transported to MRI 

## 2022-08-30 NOTE — ED Notes (Signed)
Discharge instructions reviewed with patient. Patient questions answered and opportunity for education reviewed. Patient voices understanding of discharge instructions with no further questions. Patient ambulatory with steady gait to lobby.  

## 2022-09-08 ENCOUNTER — Other Ambulatory Visit: Payer: Self-pay | Admitting: Internal Medicine

## 2022-09-08 ENCOUNTER — Ambulatory Visit
Admission: RE | Admit: 2022-09-08 | Discharge: 2022-09-08 | Disposition: A | Discharge status: Home / Self Care | Payer: Medicare Other | Source: Ambulatory Visit | Attending: Internal Medicine | Admitting: Internal Medicine

## 2022-09-08 DIAGNOSIS — M25552 Pain in left hip: Secondary | ICD-10-CM

## 2022-12-07 ENCOUNTER — Ambulatory Visit: Payer: Medicare Other | Attending: Internal Medicine | Admitting: Audiologist

## 2022-12-07 DIAGNOSIS — H903 Sensorineural hearing loss, bilateral: Secondary | ICD-10-CM | POA: Diagnosis present

## 2022-12-07 DIAGNOSIS — R42 Dizziness and giddiness: Secondary | ICD-10-CM | POA: Insufficient documentation

## 2022-12-07 NOTE — Procedures (Signed)
  Outpatient Audiology and [facility] [address] [place], [place]  [address] [phone]  AUDIOLOGICAL  EVALUATION  NAME: Adam Mathis     DOB:   08-Oct-1945      MRN: [id number]                                                                                     DATE: 12/07/2022     REFERENT: Gwenyth Bender, MD STATUS: Outpatient Diagnosis: Sensorineural hearing loss bilateral, vertigo  History: Adam Mathis was seen for an audiological evaluation due to dizziness.  When he turns his head side-to-side he will be dizzy for a couple seconds he has to be very still and then the dizziness goes away.  He denies any difficulty hearing, he says he hears what he wants to hear.  No significant history of hazardous noise exposure, ear infections, or other concerns.  No pain or pressure in either ear.  Occasional attending tinnitus that is not bothersome.  His only concern is the dizziness.   Evaluation:  Otoscopy showed a clear view of the tympanic membranes, bilaterally Tympanometry results were consistent with normal middle ear function bilaterally Audiometric testing was completed using Conventional Audiometry techniques with insert earphones and TDH headphones. Test results are consistent with normal sloping to severe sensorineural hearing loss bilaterally. Speech Recognition Thresholds were obtained at 45 dB HL in the right ear and at 35 dB HL in the left ear. Word Recognition Testing was completed at 40 dB SL and Garlen scored 92% in the left ear and 80% in the right ear. Slight asymmetry present with right ear worse at 3 and 4 kHz.  Results:  The test results were reviewed with Adam Mathis.  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  He was counseled on how this can impact hearing conversation.  He denies any difficulty hearing and reiterates his only concern is the dizziness.  He has no interest in hearing aids.   Recommendations: 1.   Recommend referral to  [facility] for evaluation for BPPV.   32 minutes spent testing and counseling on results.   If you have any questions please feel free to contact me at [phone].  Ammie Ferrier Au.D.  Audiologist   12/07/2022  11:40 AM  Cc: Gwenyth Bender, MD

## 2023-01-28 DIAGNOSIS — N451 Epididymitis: Secondary | ICD-10-CM | POA: Diagnosis not present

## 2023-02-08 DIAGNOSIS — N451 Epididymitis: Secondary | ICD-10-CM | POA: Diagnosis not present

## 2023-02-16 DIAGNOSIS — R3915 Urgency of urination: Secondary | ICD-10-CM | POA: Diagnosis not present

## 2023-02-16 DIAGNOSIS — N451 Epididymitis: Secondary | ICD-10-CM | POA: Diagnosis not present

## 2023-02-16 DIAGNOSIS — C7951 Secondary malignant neoplasm of bone: Secondary | ICD-10-CM | POA: Diagnosis not present

## 2023-02-17 ENCOUNTER — Other Ambulatory Visit: Payer: Self-pay | Admitting: Urology

## 2023-02-18 ENCOUNTER — Other Ambulatory Visit: Payer: Self-pay

## 2023-02-18 ENCOUNTER — Encounter (HOSPITAL_COMMUNITY): Payer: Self-pay | Admitting: Urology

## 2023-02-18 ENCOUNTER — Other Ambulatory Visit (HOSPITAL_COMMUNITY): Payer: Medicare Other

## 2023-02-18 NOTE — Progress Notes (Addendum)
COVID Vaccine Completed:  Date of COVID positive in last 90 days:  PCP - Willey Blade, MD Cardiologist - N/A  Chest x-ray - N/A EKG - 08-30-22 Epic Stress Test - 04-16-05 Epic ECHO -  N/A Cardiac Cath -  N/A Pacemaker/ICD device last checked: Spinal Cord Stimulator: N/A  Bowel Prep -  N/A  Sleep Study - Yes, +sleep apnea CPAP -  No  Fasting Blood Sugar -  N/A Checks Blood Sugar _____ times a day  Last dose of GLP1 agonist-  N/A GLP1 instructions:  Hold 7 days before surgery    Last dose of SGLT-2 inhibitors-  N/A SGLT-2 instructions:  Hold 3 days before surgery   Blood Thinner Instructions:  Time Aspirin Instructions:  ASA 81 Last Dose:  02-16-23  Activity level:  Can go up a flight of stairs and perform activities of daily living without stopping and without symptoms of chest pain or shortness of breath.  Anesthesia review:  N/A  Patient denies shortness of breath, fever, cough and chest pain at PAT appointment  Patient verbalized understanding of instructions that were given to them at the PAT appointment. Patient was also instructed that they will need to review over the PAT instructions again at home before surgery.

## 2023-02-19 ENCOUNTER — Other Ambulatory Visit: Payer: Self-pay

## 2023-02-19 ENCOUNTER — Encounter (HOSPITAL_COMMUNITY)
Admission: RE | Disposition: A | Discharge status: Home / Self Care | Payer: Self-pay | Source: Ambulatory Visit | Attending: Urology

## 2023-02-19 ENCOUNTER — Encounter (HOSPITAL_COMMUNITY): Payer: Self-pay | Admitting: Urology

## 2023-02-19 ENCOUNTER — Ambulatory Visit (HOSPITAL_BASED_OUTPATIENT_CLINIC_OR_DEPARTMENT_OTHER): Payer: Medicare Other | Admitting: Anesthesiology

## 2023-02-19 ENCOUNTER — Ambulatory Visit (HOSPITAL_COMMUNITY): Payer: Medicare Other | Admitting: Anesthesiology

## 2023-02-19 ENCOUNTER — Ambulatory Visit (HOSPITAL_COMMUNITY)
Admission: RE | Admit: 2023-02-19 | Discharge: 2023-02-19 | Disposition: A | Discharge status: Home / Self Care | Payer: Medicare Other | Source: Ambulatory Visit | Attending: Urology | Admitting: Urology

## 2023-02-19 DIAGNOSIS — G4730 Sleep apnea, unspecified: Secondary | ICD-10-CM | POA: Insufficient documentation

## 2023-02-19 DIAGNOSIS — N492 Inflammatory disorders of scrotum: Secondary | ICD-10-CM | POA: Insufficient documentation

## 2023-02-19 DIAGNOSIS — Z8546 Personal history of malignant neoplasm of prostate: Secondary | ICD-10-CM | POA: Insufficient documentation

## 2023-02-19 DIAGNOSIS — G4733 Obstructive sleep apnea (adult) (pediatric): Secondary | ICD-10-CM

## 2023-02-19 DIAGNOSIS — C61 Malignant neoplasm of prostate: Secondary | ICD-10-CM | POA: Insufficient documentation

## 2023-02-19 DIAGNOSIS — Z9079 Acquired absence of other genital organ(s): Secondary | ICD-10-CM | POA: Diagnosis not present

## 2023-02-19 DIAGNOSIS — E785 Hyperlipidemia, unspecified: Secondary | ICD-10-CM

## 2023-02-19 DIAGNOSIS — I10 Essential (primary) hypertension: Secondary | ICD-10-CM | POA: Insufficient documentation

## 2023-02-19 DIAGNOSIS — Z01818 Encounter for other preprocedural examination: Secondary | ICD-10-CM

## 2023-02-19 HISTORY — DX: Malignant neoplasm of prostate: C61

## 2023-02-19 HISTORY — DX: Hyperlipidemia, unspecified: E78.5

## 2023-02-19 HISTORY — DX: Personal history of urinary calculi: Z87.442

## 2023-02-19 HISTORY — PX: INCISION AND DRAINAGE ABSCESS: SHX5864

## 2023-02-19 HISTORY — DX: Sleep apnea, unspecified: G47.30

## 2023-02-19 LAB — CBC
HCT: 40 % (ref 39.0–52.0)
Hemoglobin: 12.9 g/dL — ABNORMAL LOW (ref 13.0–17.0)
MCH: 30.3 pg (ref 26.0–34.0)
MCHC: 32.3 g/dL (ref 30.0–36.0)
MCV: 93.9 fL (ref 80.0–100.0)
Platelets: 199 10*3/uL (ref 150–400)
RBC: 4.26 MIL/uL (ref 4.22–5.81)
RDW: 13.3 % (ref 11.5–15.5)
WBC: 6.6 10*3/uL (ref 4.0–10.5)
nRBC: 0 % (ref 0.0–0.2)

## 2023-02-19 LAB — BASIC METABOLIC PANEL
Anion gap: 11 (ref 5–15)
BUN: 18 mg/dL (ref 8–23)
CO2: 23 mmol/L (ref 22–32)
Calcium: 10.2 mg/dL (ref 8.9–10.3)
Chloride: 103 mmol/L (ref 98–111)
Creatinine, Ser: 1.08 mg/dL (ref 0.61–1.24)
GFR, Estimated: >60 mL/min (ref >60)
Glucose, Bld: 113 mg/dL — ABNORMAL HIGH (ref 70–99)
Potassium: 4.4 mmol/L (ref 3.5–5.1)
Sodium: 137 mmol/L (ref 135–145)

## 2023-02-19 SURGERY — INCISION AND DRAINAGE, ABSCESS
Anesthesia: General

## 2023-02-19 MED ORDER — CHLORHEXIDINE GLUCONATE 0.12 % MT SOLN
15.0000 mL | Freq: Once | OROMUCOSAL | Status: AC
  Administered 2023-02-19: 15 mL via OROMUCOSAL

## 2023-02-19 MED ORDER — 0.9 % SODIUM CHLORIDE (POUR BTL) OPTIME
TOPICAL | Status: DC | PRN
  Administered 2023-02-19: 1000 mL

## 2023-02-19 MED ORDER — CLINDAMYCIN PHOSPHATE 600 MG/50ML IV SOLN
600.0000 mg | INTRAVENOUS | Status: AC
  Administered 2023-02-19: 600 mg via INTRAVENOUS
  Filled 2023-02-19: qty 50

## 2023-02-19 MED ORDER — LIDOCAINE HCL (PF) 2 % IJ SOLN
INTRAMUSCULAR | Status: AC
Start: 2023-02-19 — End: ?
  Filled 2023-02-19: qty 5

## 2023-02-19 MED ORDER — OXYCODONE HCL 5 MG PO TABS
ORAL_TABLET | ORAL | Status: AC
  Filled 2023-02-19: qty 1

## 2023-02-19 MED ORDER — FENTANYL CITRATE (PF) 100 MCG/2ML IJ SOLN
INTRAMUSCULAR | Status: DC | PRN
  Administered 2023-02-19 (×2): 25 ug via INTRAVENOUS

## 2023-02-19 MED ORDER — ACETAMINOPHEN 10 MG/ML IV SOLN
INTRAVENOUS | Status: AC
  Filled 2023-02-19: qty 100

## 2023-02-19 MED ORDER — FENTANYL CITRATE PF 50 MCG/ML IJ SOSY
PREFILLED_SYRINGE | INTRAMUSCULAR | Status: AC
  Filled 2023-02-19: qty 2

## 2023-02-19 MED ORDER — FENTANYL CITRATE PF 50 MCG/ML IJ SOSY
25.0000 ug | PREFILLED_SYRINGE | INTRAMUSCULAR | Status: DC | PRN
  Administered 2023-02-19 (×3): 50 ug via INTRAVENOUS

## 2023-02-19 MED ORDER — FENTANYL CITRATE (PF) 100 MCG/2ML IJ SOLN
INTRAMUSCULAR | Status: AC
  Filled 2023-02-19: qty 2

## 2023-02-19 MED ORDER — OXYCODONE HCL 5 MG PO TABS
5.0000 mg | ORAL_TABLET | Freq: Once | ORAL | Status: AC
  Administered 2023-02-19: 5 mg via ORAL

## 2023-02-19 MED ORDER — SULFAMETHOXAZOLE-TRIMETHOPRIM 800-160 MG PO TABS: 1.0000 | ORAL_TABLET | Freq: Every day | ORAL | 0 refills | Status: AC

## 2023-02-19 MED ORDER — LACTATED RINGERS IV SOLN: INTRAVENOUS | Status: DC | PRN

## 2023-02-19 MED ORDER — ACETAMINOPHEN 10 MG/ML IV SOLN
1000.0000 mg | Freq: Once | INTRAVENOUS | Status: AC
  Administered 2023-02-19: 1000 mg via INTRAVENOUS

## 2023-02-19 MED ORDER — ORAL CARE MOUTH RINSE
15.0000 mL | Freq: Once | OROMUCOSAL | Status: AC
Start: 2023-02-19 — End: 2023-02-19

## 2023-02-19 MED ORDER — PROPOFOL 10 MG/ML IV BOLUS
INTRAVENOUS | Status: DC | PRN
  Administered 2023-02-19: 150 mg via INTRAVENOUS

## 2023-02-19 MED ORDER — ONDANSETRON HCL 4 MG/2ML IJ SOLN
INTRAMUSCULAR | Status: DC | PRN
  Administered 2023-02-19: 4 mg via INTRAVENOUS

## 2023-02-19 MED ORDER — HYDROCODONE-ACETAMINOPHEN 5-325 MG PO TABS: 1.0000 | ORAL_TABLET | Freq: Four times a day (QID) | ORAL | 0 refills | Status: DC | PRN

## 2023-02-19 MED ORDER — ONDANSETRON HCL 4 MG/2ML IJ SOLN
INTRAMUSCULAR | Status: AC
Start: 2023-02-19 — End: ?
  Filled 2023-02-19: qty 2

## 2023-02-19 MED ORDER — PROPOFOL 10 MG/ML IV BOLUS
INTRAVENOUS | Status: AC
  Filled 2023-02-19: qty 20

## 2023-02-19 MED ORDER — LIDOCAINE 2% (20 MG/ML) 5 ML SYRINGE
INTRAMUSCULAR | Status: DC | PRN
  Administered 2023-02-19: 60 mg via INTRAVENOUS

## 2023-02-19 MED ORDER — FENTANYL CITRATE PF 50 MCG/ML IJ SOSY
PREFILLED_SYRINGE | INTRAMUSCULAR | Status: AC
  Filled 2023-02-19: qty 1

## 2023-02-19 SURGICAL SUPPLY — 36 items
BAG COUNTER SPONGE SURGICOUNT (BAG) IMPLANT
BENZOIN TINCTURE PRP APPL 2/3 (GAUZE/BANDAGES/DRESSINGS) ×1 IMPLANT
BLADE HEX COATED 2.75 (ELECTRODE) ×1 IMPLANT
BLADE SURG 15 STRL LF DISP TIS (BLADE) ×1 IMPLANT
BNDG GAUZE DERMACEA FLUFF 4 (GAUZE/BANDAGES/DRESSINGS) ×1 IMPLANT
BRIEF MESH DISP LRG (UNDERPADS AND DIAPERS) IMPLANT
DERMABOND ADVANCED .7 DNX12 (GAUZE/BANDAGES/DRESSINGS) ×1 IMPLANT
DRAIN PENROSE 0.25X18 (DRAIN) ×1 IMPLANT
DRAIN PENROSE 0.5X18 (DRAIN) ×1 IMPLANT
DRAPE LAPAROTOMY T 98X78 PEDS (DRAPES) ×1 IMPLANT
ELECT PENCIL ROCKER SW 15FT (MISCELLANEOUS) IMPLANT
ELECT REM PT RETURN 15FT ADLT (MISCELLANEOUS) ×1 IMPLANT
GAUZE PACKING IODOFORM 1/4X15 (PACKING) IMPLANT
GAUZE PAD ABD 8X10 STRL (GAUZE/BANDAGES/DRESSINGS) IMPLANT
GAUZE SPONGE 4X4 12PLY STRL (GAUZE/BANDAGES/DRESSINGS) ×1 IMPLANT
GLOVE SURG LX STRL 7.5 STRW (GLOVE) ×1 IMPLANT
GOWN SRG XL LVL 4 BRTHBL STRL (GOWNS) ×1 IMPLANT
KIT BASIN OR (CUSTOM PROCEDURE TRAY) ×1 IMPLANT
KIT TURNOVER KIT A (KITS) IMPLANT
NDL HYPO 22X1.5 SAFETY MO (MISCELLANEOUS) IMPLANT
NEEDLE HYPO 22X1.5 SAFETY MO (MISCELLANEOUS)
NS IRRIG 1000ML POUR BTL (IV SOLUTION) IMPLANT
PACK BASIC VI WITH GOWN DISP (CUSTOM PROCEDURE TRAY) ×1 IMPLANT
PENCIL SMOKE EVACUATOR (MISCELLANEOUS) IMPLANT
SPONGE T-LAP 4X18 ~~LOC~~+RFID (SPONGE) ×2 IMPLANT
SUPPORTER AHLETIC TETRA LG (SOFTGOODS) ×1 IMPLANT
SUT MNCRL AB 4-0 PS2 18 (SUTURE) ×1 IMPLANT
SUT SILK 0 30XBRD TIE 6 (SUTURE) ×1 IMPLANT
SUT VIC AB 0 BRD 54 (SUTURE) IMPLANT
SUT VIC AB 2-0 SH 27X BRD (SUTURE) IMPLANT
SUT VIC AB 3-0 SH 27XBRD (SUTURE) ×1 IMPLANT
SWAB COLLECTION DEVICE MRSA (MISCELLANEOUS) IMPLANT
SWAB CULTURE ESWAB REG 1ML (MISCELLANEOUS) IMPLANT
SYR 20ML LL LF (SYRINGE) ×1 IMPLANT
SYR CONTROL 10ML LL (SYRINGE) IMPLANT
WATER STERILE IRR 1000ML POUR (IV SOLUTION) IMPLANT

## 2023-02-19 NOTE — Anesthesia Preprocedure Evaluation (Addendum)
Anesthesia Evaluation  Patient identified by MRN, date of birth, ID band Patient awake    Reviewed: Allergy & Precautions, H&P , NPO status , Patient's Chart, lab work & pertinent test results  Airway Mallampati: II  TM Distance: >3 FB Neck ROM: Full    Dental no notable dental hx. (+) Teeth Intact, Dental Advisory Given   Pulmonary sleep apnea  Does not use CPAP    Pulmonary exam normal breath sounds clear to auscultation       Cardiovascular hypertension, Pt. on medications  Rhythm:Regular Rate:Normal  EKG 08/30/22 Sinus rhythm RSR' in V1 or V2, right VCD or RVH Abnormal T, consider ischemia, diffuse leads Minimal ST elevation, anterior leads   Neuro/Psych Hx/o vertigo Glaucoma negative neurological ROS  negative psych ROS   GI/Hepatic negative GI ROS, Neg liver ROS,,,  Endo/Other  negative endocrine ROS  Hyperlipidemia  Renal/GU negative Renal ROS   Prostate Ca negative genitourinary   Musculoskeletal  (+) Arthritis , Osteoarthritis,    Abdominal   Peds  Hematology  (+) Blood dyscrasia, anemia   Anesthesia Other Findings   Reproductive/Obstetrics negative OB ROS Bilateral scrotal abscesses                             Anesthesia Physical Anesthesia Plan  ASA: 2  Anesthesia Plan: General   Post-op Pain Management: Precedex   Induction: Intravenous  PONV Risk Score and Plan: 4 or greater and Treatment may vary due to age or medical condition  Airway Management Planned: LMA  Additional Equipment: None  Intra-op Plan:   Post-operative Plan: Extubation in OR  Informed Consent: I have reviewed the patients History and Physical, chart, labs and discussed the procedure including the risks, benefits and alternatives for the proposed anesthesia with the patient or authorized representative who has indicated his/her understanding and acceptance.     Dental advisory  given  Plan Discussed with: CRNA  Anesthesia Plan Comments:         Anesthesia Quick Evaluation

## 2023-02-19 NOTE — Transfer of Care (Signed)
Immediate Anesthesia Transfer of Care Note  Patient: Adam Mathis  Procedure(s) Performed: INCISION AND DRAINAGE OF SCROTAL ABSCESS  Patient Location: PACU  Anesthesia Type:General  Level of Consciousness: awake, alert , and oriented  Airway & Oxygen Therapy: Patient Spontanous Breathing and Patient connected to face mask oxygen  Post-op Assessment: Report given to RN, Post -op Vital signs reviewed and stable, and Patient moving all extremities X 4  Post vital signs: Reviewed and stable  Last Vitals:  Vitals Value Taken Time  BP 161/80   Temp    Pulse 57 02/19/23 1528  Resp 16 02/19/23 1528  SpO2 100 % 02/19/23 1528  Vitals shown include unfiled device data.  Last Pain: There were no vitals filed for this visit.       Complications: No notable events documented.

## 2023-02-19 NOTE — H&P (Signed)
Adam Mathis is an 78 y.o. male.    Chief Complaint: Pre-OP Incision + Drainage of Scrotal Abscesses  HPI:   1 - Prostate Cancer-  Initial Diagnosis - 12/12 cores Grade 4 disease 2019  Primary Therapy - none / androgen deprivation only  Prior Treatments - LHRH agonists, orchiectomy 2023 (for cost savings)  Most Recent Restaging - 06/2021 CT, BS - stable T11 mets  Current Treatment - androgen deprivation   Recent Course:  03/2021 PSA 2.92, T <12, CMP Nl ==> Eligared 22.6 + XGeva; 07/2021 PSA 2, T 10, CMP Nl ==> Eligard 22.5; 11/2021 PSA 3.37, T 17, CMP Nl  11/2021 - bilateral simple orchiectomy.  02/2022 - PSA 3.0, T <10, CMP Nl; 05/2022 - PSA 2.29, T 36, CMP Nl; 11/2022 PSA 1.38, T 17, CMP Nl   2 -  Groin Pain / Scrotal Abscess - months of L>R scrotal pain and swelling. Exam 01/2023 with developed bilateral small scrotal abscessees, each abtou 2-3cm. NO fevers. Treated with empiric doxy course but still present.   PMH sig for mid HTN. NO ischemic CV disease / blood thinners, cataract surgery. He is retired from [place] [employer], heavy Arboriculturist. His PCP is Willey Blade MD.   Today " Adam Mathis " is seen to proceed with I+D of scrotal abscesses. No interval fevers.   Past Medical History:  Diagnosis Date   Degenerative joint disease    History of kidney stones    Age 6   Hyperlipidemia    Hypertension    Prostate cancer (HCC)    Sleep apnea    No CPAP   Vertigo of central origin     Past Surgical History:  Procedure Laterality Date   CARPAL TUNNEL RELEASE  1990   GLAUCOMA SURGERY Left    ORCHIECTOMY Bilateral 12/12/2021   Procedure: ORCHIECTOMY;  Surgeon: Sebastian Ache, MD;  Location: [facility];  Service: Urology;  Laterality: Bilateral;  1 HR   PROSTATE BIOPSY     TONSILLECTOMY      No family history on file. Social History:  reports that he has never smoked. He has never used smokeless tobacco. He reports that he does not drink alcohol and does not  use drugs.  Allergies: No Known Allergies  No medications prior to admission.    No results found for this or any previous visit (from the past 48 hours). No results found.  Review of Systems  Constitutional:  Negative for chills and fever.  Genitourinary:  Positive for scrotal swelling.  All other systems reviewed and are negative.   Height 6' (1.829 m), weight 88 kg. Physical Exam Vitals reviewed.  Constitutional:      Comments: Very pleasant and mentally spry, at baseline.   HENT:     Head: Normocephalic.     Nose: Nose normal.  Eyes:     Pupils: Pupils are equal, round, and reactive to light.  Cardiovascular:     Rate and Rhythm: Normal rate.  Pulmonary:     Effort: Pulmonary effort is normal.  Abdominal:     General: Abdomen is flat.  Genitourinary:    Comments: Bilateral testes surgcially absent. Bilateral approx 2-3cm cutaneous abscesses in scrotum with scant drainage. NO crepitus / necrosis / abdominal or buttocks tracking.  Musculoskeletal:        General: Normal range of motion.     Cervical back: Normal range of motion.  Skin:    General: Skin is warm.  Neurological:  General: No focal deficit present.     Mental Status: He is alert.  Psychiatric:        Mood and Affect: Mood normal.      Assessment/Plan  Proceed as planned with I+D scrotal abscesses. Risks,  benefits, alternatives, expected peri-op course discussed previously and rieterated today. He understands high liklihood of packable wound post-op that will require local wound care for several weeks.   Loletta Parish., MD 02/19/2023, 7:06 AM

## 2023-02-19 NOTE — Brief Op Note (Signed)
02/19/2023  3:17 PM  PATIENT:  Adam Mathis  78 y.o. male  PRE-OPERATIVE DIAGNOSIS:  BILATERAL SCROTAL ABSCESS  POST-OPERATIVE DIAGNOSIS:  BILATERAL SCROTAL ABSCESS  PROCEDURE:  Procedure(s) with comments: INCISION AND DRAINAGE OF SCROTAL ABSCESS (N/A) - 45 MINUTES NEEDED FOR CASE  SURGEON:  Surgeons and Role:    * Gabreille Dardis, Delbert Phenix., MD - Primary  PHYSICIAN ASSISTANT:   ASSISTANTS: none   ANESTHESIA:   general  EBL:  20mL  BLOOD ADMINISTERED:none  DRAINS: none   LOCAL MEDICATIONS USED:  NONE  SPECIMEN:  scrotal abscess fluid  DISPOSITION OF SPECIMEN:   microbiology  COUNTS:  YES  TOURNIQUET:  * No tourniquets in log *  DICTATION: .Other Dictation: Dictation Number [id number]  PLAN OF CARE: Discharge to home after PACU  PATIENT DISPOSITION:  PACU - hemodynamically stable.   Delay start of Pharmacological VTE agent (>24hrs) due to surgical blood loss or risk of bleeding: not applicable

## 2023-02-19 NOTE — Op Note (Unsigned)
NAME: Adam Mathis, JEFFUS MEDICAL RECORD NO: [id number] ACCOUNT NO: [id number] DATE OF BIRTH: 1945/06/17 FACILITY: [facility] LOCATION: [facility] PHYSICIAN: Sebastian Ache, MD  Operative Report   DATE OF PROCEDURE: 02/19/2023  PREOPERATIVE DIAGNOSIS:  Left greater than right scrotal abscesses.  POSTOPERATIVE DIAGNOSIS:  Left greater than right scrotal abscesses.  PROCEDURE PERFORMED:  Incision and drainage, bilateral scrotal abscess.  Marland Kitchen  MEDICATIONS:  None.  SPECIMENS:  Scrotal abscess, fluid, for gram stain, and culture.  FINDINGS: 1.  Bilateral approximately 2-3 cm abscesses involving the skin and subcutaneous tissue and distal cord stumps. 2.  The proximal cord stumps completely uninvolved. 3.  Revision of bilateral cord stumps with Vicryl ties.  INDICATION:  The patient is a pleasant 78 year old man with a history of metastatic prostate cancer. He is status post orchiectomy previously for androgen ablation, which resulted in excellent control of his prostate cancer. This was over a year ago. He  has found on workup of groin pain with unusual induration of his left cord stump concerning for possible unusual and rare epididymitis of the cord stump. He was treated with antimicrobials and his pain improved some. However, he re-presented with  drainage of his scrotal area and was found to have left greater than right scrotal abscesses. IUt was clearly felt that operative intervention was warranted with incision and drainage.  Informed consent was obtained placed on the record.  DESCRIPTION OF PROCEDURE:  The patient being Adam Mathis and procedure being bilateral scrotal abscess drainage was confirmed. Procedure timeout performed.  Intravenous antibiotics administered, general LMA anesthesia induced. The patient was placed  into a low lithotomy position. Sterile field was created. Prepped and draped the patient's penis, perineum and proximal thighs using iodine. On the left side, there was a  small cutaneous draining sinus of purulent fluid. It was approximately 2-3 cm.  There was no crepitus. Abscess fluid was expressed to the and collected on culture swab for gram stain and culture. This area was then incised and carried down to the deeper tissue where the previous distal cord stump was easily visualized. There was in situ silk ties at this  location as anticipated. Notably, the proximal cord stump was completely uninvolved in the process. Given the nature of the nonabsorbable silk, felt that revision of the cord stump was clearly warranted to remove any foreign body, which could be nidus  for future infections. As such, the cord stump on the left mobilized to position approximately 2 cm distal to the internal ring. A Vicryl tie was applied proximally. The distal core stump was amputated and safely discarded. I then completely removed the  prior silk tie that was present. The left hemiscrotal abscess area was copiously irrigated. Again, there was no crepitus. There was no tracking really beyond the subcutaneous tissue. This was loosely reapproximated the subcu using interrupted Vicryl x2  and the skin left open and packed with Iodoform gauze. On the right side, there was a similar small cutaneous draining sinus, which was incised. Essentially the exact same thing was seen as for the left; however, the abscess fluid cavity was smaller at  approximately 1.5 cm. Similarly, this was opened. The cord stump was visualized and controlled more proximally, ligated with Vicryl tie. The distal cord stump removed including all foreign body including silk-type material removed. This was copiously  irrigated. We approximated using interrupted Vicryl x2. The skin left open and packed with Iodoform gauze as well. Dressing and mesh underwear and ABD pad was placed and procedure was  terminated. The patient tolerated the procedure well.  No immediate  periprocedural complications.  The patient was taken to the  postanesthesia care unit in stable condition. The plan was to discharge home.   SHY D: 02/19/2023 3:23:26 pm T: 02/19/2023 4:08:00 pm  JOB: [id number]/ [id number]

## 2023-02-19 NOTE — Anesthesia Postprocedure Evaluation (Signed)
Anesthesia Post Note  Patient: Adam Mathis  Procedure(s) Performed: INCISION AND DRAINAGE OF SCROTAL ABSCESS     Patient location during evaluation: PACU Anesthesia Type: General Level of consciousness: awake and alert Pain management: pain level controlled Vital Signs Assessment: post-procedure vital signs reviewed and stable Respiratory status: spontaneous breathing, nonlabored ventilation and respiratory function stable Cardiovascular status: blood pressure returned to baseline and stable Postop Assessment: no apparent nausea or vomiting Anesthetic complications: no  No notable events documented.  Last Vitals:  Vitals:   02/19/23 1605 02/19/23 1615  BP:  (!) 171/78  Pulse: (!) 53 (!) 49  Resp: 11 13  Temp:    SpO2: 100% 95%    Last Pain:  Vitals:   02/19/23 1554  PainSc: 8                  Aragorn Recker,W. EDMOND

## 2023-02-19 NOTE — Anesthesia Procedure Notes (Signed)
Procedure Name: LMA Insertion Date/Time: 02/19/2023 2:44 PM  Performed by: Nelle Don, CRNAPre-anesthesia Checklist: Patient identified, Emergency Drugs available, Patient being monitored and Suction available Patient Re-evaluated:Patient Re-evaluated prior to induction Oxygen Delivery Method: Circle system utilized Preoxygenation: Pre-oxygenation with 100% oxygen Induction Type: IV induction LMA: LMA inserted LMA Size: 4.0 Number of attempts: 1 Dental Injury: Teeth and Oropharynx as per pre-operative assessment

## 2023-02-19 NOTE — Discharge Instructions (Addendum)
1 - Change gauze packing in scrotal skin every other day by remove packing, shower normally (it is OK for wound to get wet and soap / shampoo to touch it), and gently replace packing. The wounds will take several weeks to heal completely.   2 - Call MD or go to ER for fever >102, severe pain / nausea / vomiting not relieved by medications, or acute change in medical status

## 2023-02-20 ENCOUNTER — Encounter (HOSPITAL_COMMUNITY): Payer: Self-pay | Admitting: Urology

## 2023-02-24 LAB — AEROBIC/ANAEROBIC CULTURE W GRAM STAIN (SURGICAL/DEEP WOUND): Gram Stain: NONE SEEN

## 2023-03-02 DIAGNOSIS — C7951 Secondary malignant neoplasm of bone: Secondary | ICD-10-CM | POA: Diagnosis not present

## 2023-03-02 DIAGNOSIS — R3915 Urgency of urination: Secondary | ICD-10-CM | POA: Diagnosis not present

## 2023-03-27 ENCOUNTER — Encounter (HOSPITAL_COMMUNITY): Payer: Self-pay | Admitting: Emergency Medicine

## 2023-03-27 ENCOUNTER — Emergency Department (HOSPITAL_COMMUNITY)

## 2023-03-27 ENCOUNTER — Observation Stay (HOSPITAL_COMMUNITY)

## 2023-03-27 ENCOUNTER — Observation Stay (HOSPITAL_COMMUNITY)
Admission: EM | Admit: 2023-03-27 | Discharge: 2023-03-29 | Disposition: A | Discharge status: Home Health | Attending: Emergency Medicine | Admitting: Emergency Medicine

## 2023-03-27 ENCOUNTER — Other Ambulatory Visit: Payer: Self-pay

## 2023-03-27 DIAGNOSIS — R7401 Elevation of levels of liver transaminase levels: Secondary | ICD-10-CM | POA: Diagnosis not present

## 2023-03-27 DIAGNOSIS — N400 Enlarged prostate without lower urinary tract symptoms: Secondary | ICD-10-CM | POA: Diagnosis present

## 2023-03-27 DIAGNOSIS — I672 Cerebral atherosclerosis: Secondary | ICD-10-CM | POA: Diagnosis not present

## 2023-03-27 DIAGNOSIS — Z9181 History of falling: Secondary | ICD-10-CM | POA: Insufficient documentation

## 2023-03-27 DIAGNOSIS — Z8673 Personal history of transient ischemic attack (TIA), and cerebral infarction without residual deficits: Secondary | ICD-10-CM | POA: Diagnosis not present

## 2023-03-27 DIAGNOSIS — E871 Hypo-osmolality and hyponatremia: Secondary | ICD-10-CM | POA: Insufficient documentation

## 2023-03-27 DIAGNOSIS — Z7982 Long term (current) use of aspirin: Secondary | ICD-10-CM | POA: Insufficient documentation

## 2023-03-27 DIAGNOSIS — E860 Dehydration: Principal | ICD-10-CM | POA: Insufficient documentation

## 2023-03-27 DIAGNOSIS — I10 Essential (primary) hypertension: Secondary | ICD-10-CM | POA: Diagnosis not present

## 2023-03-27 DIAGNOSIS — C7951 Secondary malignant neoplasm of bone: Secondary | ICD-10-CM | POA: Insufficient documentation

## 2023-03-27 DIAGNOSIS — Z79899 Other long term (current) drug therapy: Secondary | ICD-10-CM | POA: Diagnosis not present

## 2023-03-27 DIAGNOSIS — C61 Malignant neoplasm of prostate: Secondary | ICD-10-CM | POA: Diagnosis not present

## 2023-03-27 DIAGNOSIS — G9341 Metabolic encephalopathy: Secondary | ICD-10-CM | POA: Diagnosis not present

## 2023-03-27 DIAGNOSIS — G9340 Encephalopathy, unspecified: Secondary | ICD-10-CM

## 2023-03-27 DIAGNOSIS — E785 Hyperlipidemia, unspecified: Secondary | ICD-10-CM | POA: Diagnosis not present

## 2023-03-27 DIAGNOSIS — N179 Acute kidney failure, unspecified: Secondary | ICD-10-CM | POA: Insufficient documentation

## 2023-03-27 DIAGNOSIS — Z1152 Encounter for screening for COVID-19: Secondary | ICD-10-CM | POA: Diagnosis not present

## 2023-03-27 DIAGNOSIS — R0602 Shortness of breath: Secondary | ICD-10-CM | POA: Diagnosis not present

## 2023-03-27 DIAGNOSIS — E876 Hypokalemia: Secondary | ICD-10-CM | POA: Insufficient documentation

## 2023-03-27 DIAGNOSIS — I700 Atherosclerosis of aorta: Secondary | ICD-10-CM | POA: Diagnosis not present

## 2023-03-27 DIAGNOSIS — I6782 Cerebral ischemia: Secondary | ICD-10-CM | POA: Diagnosis not present

## 2023-03-27 DIAGNOSIS — R109 Unspecified abdominal pain: Secondary | ICD-10-CM | POA: Diagnosis not present

## 2023-03-27 DIAGNOSIS — R531 Weakness: Secondary | ICD-10-CM | POA: Diagnosis not present

## 2023-03-27 LAB — BASIC METABOLIC PANEL
Anion gap: 14 (ref 5–15)
BUN: 28 mg/dL — ABNORMAL HIGH (ref 8–23)
CO2: 19 mmol/L — ABNORMAL LOW (ref 22–32)
Calcium: 9.1 mg/dL (ref 8.9–10.3)
Chloride: 98 mmol/L (ref 98–111)
Creatinine, Ser: 1.78 mg/dL — ABNORMAL HIGH (ref 0.61–1.24)
GFR, Estimated: 39 mL/min — ABNORMAL LOW (ref >60)
Glucose, Bld: 150 mg/dL — ABNORMAL HIGH (ref 70–99)
Potassium: 3.4 mmol/L — ABNORMAL LOW (ref 3.5–5.1)
Sodium: 131 mmol/L — ABNORMAL LOW (ref 135–145)

## 2023-03-27 LAB — CBC
HCT: 35.5 % — ABNORMAL LOW (ref 39.0–52.0)
Hemoglobin: 12 g/dL — ABNORMAL LOW (ref 13.0–17.0)
MCH: 30.4 pg (ref 26.0–34.0)
MCHC: 33.8 g/dL (ref 30.0–36.0)
MCV: 89.9 fL (ref 80.0–100.0)
Platelets: 162 10*3/uL (ref 150–400)
RBC: 3.95 MIL/uL — ABNORMAL LOW (ref 4.22–5.81)
RDW: 13.8 % (ref 11.5–15.5)
WBC: 10.9 10*3/uL — ABNORMAL HIGH (ref 4.0–10.5)
nRBC: 0 % (ref 0.0–0.2)

## 2023-03-27 LAB — RAPID URINE DRUG SCREEN, HOSP PERFORMED
Amphetamines: NOT DETECTED
Barbiturates: NOT DETECTED
Benzodiazepines: NOT DETECTED
Cocaine: NOT DETECTED
Opiates: NOT DETECTED
Tetrahydrocannabinol: NOT DETECTED

## 2023-03-27 LAB — URINALYSIS, ROUTINE W REFLEX MICROSCOPIC
Bilirubin Urine: NEGATIVE
Glucose, UA: NEGATIVE mg/dL
Ketones, ur: 5 mg/dL — AB
Leukocytes,Ua: NEGATIVE
Nitrite: NEGATIVE
Protein, ur: 100 mg/dL — AB
Specific Gravity, Urine: 1.018 (ref 1.005–1.030)
pH: 5 (ref 5.0–8.0)

## 2023-03-27 LAB — LIPASE, BLOOD: Lipase: 51 U/L (ref 11–51)

## 2023-03-27 LAB — RESP PANEL BY RT-PCR (RSV, FLU A&B, COVID)  RVPGX2
Influenza A by PCR: NEGATIVE
Influenza B by PCR: NEGATIVE
Resp Syncytial Virus by PCR: NEGATIVE
SARS Coronavirus 2 by RT PCR: NEGATIVE

## 2023-03-27 LAB — TSH: TSH: 1.478 u[IU]/mL (ref 0.350–4.500)

## 2023-03-27 LAB — HEPATIC FUNCTION PANEL
ALT: 66 U/L — ABNORMAL HIGH (ref 0–44)
AST: 90 U/L — ABNORMAL HIGH (ref 15–41)
Albumin: 3.6 g/dL (ref 3.5–5.0)
Alkaline Phosphatase: 62 U/L (ref 38–126)
Bilirubin, Direct: 0.3 mg/dL — ABNORMAL HIGH (ref 0.0–0.2)
Indirect Bilirubin: 0.7 mg/dL (ref 0.3–0.9)
Total Bilirubin: 1 mg/dL (ref 0.0–1.2)
Total Protein: 8 g/dL (ref 6.5–8.1)

## 2023-03-27 LAB — MAGNESIUM: Magnesium: 2.1 mg/dL (ref 1.7–2.4)

## 2023-03-27 LAB — TROPONIN I (HIGH SENSITIVITY): Troponin I (High Sensitivity): 40 ng/L — ABNORMAL HIGH (ref <18)

## 2023-03-27 LAB — ETHANOL: Alcohol, Ethyl (B): <10 mg/dL (ref <10)

## 2023-03-27 LAB — AMMONIA: Ammonia: 11 umol/L (ref 9–35)

## 2023-03-27 LAB — VITAMIN B12: Vitamin B-12: 432 pg/mL (ref 180–914)

## 2023-03-27 LAB — CBG MONITORING, ED: Glucose-Capillary: 134 mg/dL — ABNORMAL HIGH (ref 70–99)

## 2023-03-27 MED ORDER — ONDANSETRON HCL 4 MG PO TABS: 4.0000 mg | ORAL_TABLET | Freq: Four times a day (QID) | ORAL | Status: DC | PRN

## 2023-03-27 MED ORDER — ALBUTEROL SULFATE HFA 108 (90 BASE) MCG/ACT IN AERS
2.0000 | INHALATION_SPRAY | RESPIRATORY_TRACT | Status: DC | PRN
Start: 2023-03-27 — End: 2023-03-27
  Administered 2023-03-27: 2 via RESPIRATORY_TRACT
  Filled 2023-03-27: qty 6.7

## 2023-03-27 MED ORDER — ALBUTEROL SULFATE (2.5 MG/3ML) 0.083% IN NEBU: 2.5000 mg | INHALATION_SOLUTION | RESPIRATORY_TRACT | Status: DC | PRN

## 2023-03-27 MED ORDER — SODIUM CHLORIDE 0.9 % IV SOLN: INTRAVENOUS | Status: AC

## 2023-03-27 MED ORDER — ACETAMINOPHEN 650 MG RE SUPP: 650.0000 mg | Freq: Four times a day (QID) | RECTAL | Status: DC | PRN

## 2023-03-27 MED ORDER — SODIUM CHLORIDE 0.9 % IV BOLUS
1000.0000 mL | Freq: Once | INTRAVENOUS | Status: AC
  Administered 2023-03-27: 1000 mL via INTRAVENOUS

## 2023-03-27 MED ORDER — ONDANSETRON HCL 4 MG/2ML IJ SOLN: 4.0000 mg | Freq: Four times a day (QID) | INTRAMUSCULAR | Status: DC | PRN

## 2023-03-27 MED ORDER — ACETAMINOPHEN 325 MG PO TABS
650.0000 mg | ORAL_TABLET | Freq: Four times a day (QID) | ORAL | Status: DC | PRN
  Administered 2023-03-28: 650 mg via ORAL
  Filled 2023-03-27: qty 2

## 2023-03-27 NOTE — Assessment & Plan Note (Signed)
 Diagnosed in 2019.  orchiectomy 2023, androgen deprivation. No other therapy.  Most Recent Restaging - 06/2021 CT, BS - stable T11 mets

## 2023-03-27 NOTE — Assessment & Plan Note (Addendum)
 Hold proscar until orthostatic done

## 2023-03-27 NOTE — Assessment & Plan Note (Signed)
 Likely hypovolemia hyponatremia Continue IVF overnight  If no improvement, urine studies

## 2023-03-27 NOTE — ED Notes (Signed)
 ED TO INPATIENT HANDOFF REPORT  ED Nurse Name and Phone #: Majel Homer, RN   S Name/Age/Gender Adam Mathis 78 y.o. male Room/Bed: WA02/WA02  Code Status   Code Status: Full Code  Home/SNF/Other Home Patient oriented to: self, place, time, and situation Is this baseline? Yes   Triage Complete: Triage complete  Chief Complaint AKI (acute kidney injury) (HCC) [N17.9]  Triage Note Patient presents post fall with weakness, blurry and abdominal pain. He is normally very active, but now needs help getting out of bed. Last night he fell. Weakness began 3-4 days ago. Low PO intake. He recently had a procedure done 02/16/2023 and was told to come here by his MD.    Allergies Allergies  Allergen Reactions   Penicillins Hives and Rash   Sulfa Antibiotics Other (See Comments)    Stomach distress    Level of Care/Admitting Diagnosis ED Disposition     ED Disposition  Admit   Condition  --   Comment  Hospital Area: [facility] [[id number]]  Level of Care: Telemetry [5]  Admit to tele based on following criteria: Other see comments  Comments: rate control  May place patient in observation at [facility] or [facility] if equivalent level of care is available:: Yes  Covid Evaluation: Confirmed COVID Negative  Diagnosis: AKI (acute kidney injury) [facility]) [[id number]]  Admitting Physician: Orland Mustard [[id number]]  Attending Physician: Orland Mustard [[id number]]          B Medical/Surgery History Past Medical History:  Diagnosis Date   Degenerative joint disease    History of kidney stones    Age 50   Hyperlipidemia    Hypertension    Prostate cancer (HCC)    Sleep apnea    No CPAP   Vertigo of central origin    Past Surgical History:  Procedure Laterality Date   CARPAL TUNNEL RELEASE  1990   GLAUCOMA SURGERY Left    INCISION AND DRAINAGE ABSCESS N/A 02/19/2023   Procedure: INCISION AND DRAINAGE OF SCROTAL ABSCESS;  Surgeon: Loletta Parish., MD;   Location: [facility];  Service: Urology;  Laterality: N/A;  45 MINUTES NEEDED FOR CASE   ORCHIECTOMY Bilateral 12/12/2021   Procedure: ORCHIECTOMY;  Surgeon: Sebastian Ache, MD;  Location: [facility];  Service: Urology;  Laterality: Bilateral;  1 HR   PROSTATE BIOPSY     TONSILLECTOMY       A IV Location/Drains/Wounds Patient Lines/Drains/Airways Status     Active Line/Drains/Airways     Name Placement date Placement time Site Days   Peripheral IV 03/27/23 20 G Posterior;Right Forearm 03/27/23  1408  Forearm  less than 1            Intake/Output Last 24 hours  Intake/Output Summary (Last 24 hours) at 03/27/2023 2102 Last data filed at 03/27/2023 1930 Gross per 24 hour  Intake --  Output 400 ml  Net -400 ml    Labs/Imaging Results for orders placed or performed during the hospital encounter of 03/27/23 (from the past 48 hours)  CBG monitoring, ED     Status: Abnormal   Collection Time: 03/27/23 12:57 PM  Result Value Ref Range   Glucose-Capillary 134 (H) 70 - 99 mg/dL    Comment: Glucose reference range applies only to samples taken after fasting for at least 8 hours.  Basic metabolic panel     Status: Abnormal   Collection Time: 03/27/23  1:15 PM  Result Value Ref Range  Sodium 131 (L) 135 - 145 mmol/L   Potassium 3.4 (L) 3.5 - 5.1 mmol/L   Chloride 98 98 - 111 mmol/L   CO2 19 (L) 22 - 32 mmol/L   Glucose, Bld 150 (H) 70 - 99 mg/dL    Comment: Glucose reference range applies only to samples taken after fasting for at least 8 hours.   BUN 28 (H) 8 - 23 mg/dL   Creatinine, Ser 2.84 (H) 0.61 - 1.24 mg/dL   Calcium 9.1 8.9 - 13.2 mg/dL   GFR, Estimated 39 (L) >60 mL/min    Comment: (NOTE) Calculated using the CKD-EPI Creatinine Equation (2021)    Anion gap 14 5 - 15    Comment: Performed at [facility], [address] [place], [place] [address]  CBC     Status: Abnormal   Collection Time: 03/27/23  1:15 PM  Result Value Ref Range    WBC 10.9 (H) 4.0 - 10.5 K/uL   RBC 3.95 (L) 4.22 - 5.81 MIL/uL   Hemoglobin 12.0 (L) 13.0 - 17.0 g/dL   HCT 27.2 (L) 53.6 - 64.4 %   MCV 89.9 80.0 - 100.0 fL   MCH 30.4 26.0 - 34.0 pg   MCHC 33.8 30.0 - 36.0 g/dL   RDW 03.4 74.2 - 59.5 %   Platelets 162 150 - 400 K/uL   nRBC 0.0 0.0 - 0.2 %    Comment: Performed at [facility], [address] [place], [place] [address]  Lipase, blood     Status: None   Collection Time: 03/27/23  1:15 PM  Result Value Ref Range   Lipase 51 11 - 51 U/L    Comment: Performed at [facility], [address] [place], [place] [address]  Hepatic function panel     Status: Abnormal   Collection Time: 03/27/23  2:02 PM  Result Value Ref Range   Total Protein 8.0 6.5 - 8.1 g/dL   Albumin 3.6 3.5 - 5.0 g/dL   AST 90 (H) 15 - 41 U/L   ALT 66 (H) 0 - 44 U/L   Alkaline Phosphatase 62 38 - 126 U/L   Total Bilirubin 1.0 0.0 - 1.2 mg/dL   Bilirubin, Direct 0.3 (H) 0.0 - 0.2 mg/dL   Indirect Bilirubin 0.7 0.3 - 0.9 mg/dL    Comment: Performed at [facility], [address], [place], [place] [address]  Ethanol     Status: None   Collection Time: 03/27/23  2:02 PM  Result Value Ref Range   Alcohol, Ethyl (B) <10 <10 mg/dL    Comment: (NOTE) Lowest detectable limit for serum alcohol is 10 mg/dL.  For medical purposes only. Performed at [facility], [address] [place], [place] [address]   Resp panel by RT-PCR (RSV, Flu A&B, Covid) Anterior Nasal Swab     Status: None   Collection Time: 03/27/23  2:02 PM   Specimen: Anterior Nasal Swab  Result Value Ref Range   SARS Coronavirus 2 by RT PCR NEGATIVE NEGATIVE    Comment: (NOTE) SARS-CoV-2 target nucleic acids are NOT DETECTED.  The SARS-CoV-2 RNA is generally detectable in upper respiratory specimens during the acute phase of infection. The lowest concentration of SARS-CoV-2 viral copies this assay can detect is 138  copies/mL. A negative result does not preclude SARS-Cov-2 infection and should not be used as the sole basis for treatment or other patient management decisions. A negative result may occur with  improper specimen collection/handling, submission of specimen other than nasopharyngeal swab, presence of viral mutation(s) within the areas targeted by this assay, and inadequate number of viral copies(<138 copies/mL). A negative result must be combined with clinical observations, patient history, and epidemiological information. The expected result is Negative.  Fact Sheet for Patients:  BloggerCourse.com  Fact Sheet for Healthcare Providers:  SeriousBroker.it  This test is no t yet approved or cleared by the [place] FDA and  has been authorized for detection and/or diagnosis of SARS-CoV-2 by FDA under an Emergency Use Authorization (EUA). This EUA will remain  in effect (meaning this test can be used) for the duration of the COVID-19 declaration under Section 564(b)(1) of the Act, 21 U.S.C.section 360bbb-3(b)(1), unless the authorization is terminated  or revoked sooner.       Influenza A by PCR NEGATIVE NEGATIVE   Influenza B by PCR NEGATIVE NEGATIVE    Comment: (NOTE) The Xpert Xpress SARS-CoV-2/FLU/RSV plus assay is intended as an aid in the diagnosis of influenza from Nasopharyngeal swab specimens and should not be used as a sole basis for treatment. Nasal washings and aspirates are unacceptable for Xpert Xpress SARS-CoV-2/FLU/RSV testing.  Fact Sheet for Patients: BloggerCourse.com  Fact Sheet for Healthcare Providers: SeriousBroker.it  This test is not yet approved or cleared by the [place] FDA and has been authorized for detection and/or diagnosis of SARS-CoV-2 by FDA under an Emergency Use Authorization (EUA). This EUA will remain in effect (meaning this test  can be used) for the duration of the COVID-19 declaration under Section 564(b)(1) of the Act, 21 U.S.C. section 360bbb-3(b)(1), unless the authorization is terminated or revoked.     Resp Syncytial Virus by PCR NEGATIVE NEGATIVE    Comment: (NOTE) Fact Sheet for Patients: BloggerCourse.com  Fact Sheet for Healthcare Providers: SeriousBroker.it  This test is not yet approved or cleared by the [place] FDA and has been authorized for detection and/or diagnosis of SARS-CoV-2 by FDA under an Emergency Use Authorization (EUA). This EUA will remain in effect (meaning this test can be used) for the duration of the COVID-19 declaration under Section 564(b)(1) of the Act, 21 U.S.C. section 360bbb-3(b)(1), unless the authorization is terminated or revoked.  Performed at [facility], [address], [place], [place] [address]   Urinalysis, Routine w reflex microscopic -Urine, Clean Catch     Status: Abnormal   Collection Time: 03/27/23  4:13 PM  Result Value Ref Range   Color, Urine YELLOW YELLOW   APPearance HAZY (A) CLEAR   Specific Gravity, Urine 1.018 1.005 - 1.030   pH 5.0 5.0 - 8.0   Glucose, UA NEGATIVE NEGATIVE mg/dL   Hgb urine dipstick MODERATE (A) NEGATIVE   Bilirubin Urine NEGATIVE NEGATIVE   Ketones, ur 5 (A) NEGATIVE mg/dL   Protein, ur 295 (A) NEGATIVE mg/dL   Nitrite NEGATIVE NEGATIVE   Leukocytes,Ua NEGATIVE NEGATIVE   RBC / HPF 0-5 0 - 5 RBC/hpf   WBC, UA 6-10 0 - 5 WBC/hpf   Bacteria, UA RARE (A) NONE SEEN   Squamous Epithelial / HPF 0-5 0 - 5 /HPF   Mucus PRESENT    Hyaline Casts, UA PRESENT    Granular Casts, UA PRESENT     Comment: Performed at [facility], [address], [place], [place] [address]  Urine rapid drug screen (hosp performed)     Status: None   Collection Time: 03/27/23  4:13 PM  Result Value Ref Range   Opiates  NONE DETECTED NONE DETECTED    Cocaine NONE DETECTED NONE DETECTED   Benzodiazepines NONE DETECTED NONE DETECTED   Amphetamines NONE DETECTED NONE DETECTED   Tetrahydrocannabinol NONE DETECTED NONE DETECTED   Barbiturates NONE DETECTED NONE DETECTED    Comment: (NOTE) DRUG SCREEN FOR MEDICAL PURPOSES ONLY.  IF CONFIRMATION IS NEEDED FOR ANY PURPOSE, NOTIFY LAB WITHIN 5 DAYS.  LOWEST DETECTABLE LIMITS FOR URINE DRUG SCREEN Drug Class                     Cutoff (ng/mL) Amphetamine and metabolites    1000 Barbiturate and metabolites    200 Benzodiazepine                 200 Opiates and metabolites        300 Cocaine and metabolites        300 THC                            50 Performed at [facility], [address] [place], [place] [address]   Ammonia     Status: None   Collection Time: 03/27/23  5:31 PM  Result Value Ref Range   Ammonia 11 9 - 35 umol/L    Comment: Performed at [facility], [address] [place], [place] [address]  TSH     Status: None   Collection Time: 03/27/23  6:00 PM  Result Value Ref Range   TSH 1.478 0.350 - 4.500 uIU/mL    Comment: Performed by a 3rd Generation assay with a functional sensitivity of <=0.01 uIU/mL. Performed at [facility], [address] [place], [place] [address]   Vitamin B12     Status: None   Collection Time: 03/27/23  6:00 PM  Result Value Ref Range   Vitamin B-12 432 180 - 914 pg/mL    Comment: (NOTE) This assay is not validated for testing neonatal or myeloproliferative syndrome specimens for Vitamin B12 levels. Performed at [facility], [address], [place], [place] [address]    CT Head Wo Contrast Result Date: 03/27/2023 CLINICAL DATA:  Mental status change, unknown cause Status post fall with weakness. EXAM: CT HEAD WITHOUT CONTRAST TECHNIQUE: Contiguous axial images were obtained from the base of the skull through the vertex without intravenous contrast. RADIATION  DOSE REDUCTION: This exam was performed according to the departmental dose-optimization program which includes automated exposure control, adjustment of the mA and/or kV according to patient size and/or use of iterative reconstruction technique. COMPARISON:  Brain MRI 08/30/2022 FINDINGS: Brain: No intracranial hemorrhage, mass effect, or midline shift. Stable atrophy. No hydrocephalus. The basilar cisterns are patent. Periventricular and deep white matter hypodensity consistent with chronic small vessel ischemia. 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 No evidence of territorial infarct or acute ischemia. No extra-axial or intracranial fluid collection. Vascular: Atherosclerosis of skullbase vasculature without hyperdense vessel or abnormal calcification. Skull: No fracture or focal lesion. Sinuses/Orbits: No acute finding.  Bilateral cataract resection. Other: None. IMPRESSION: 1.  No acute intracranial abnormality. 2. Stable atrophy and chronic ischemia. Electronically Signed   By: Narda Rutherford M.D.   On: 03/27/2023 17:11   DG Chest 2 View Result Date: 03/27/2023 CLINICAL DATA:  Shortness of breath. Status post fall with weakness, blurry vision and abdominal pain. EXAM: CHEST - 2 VIEW COMPARISON:  Radiographs 11/11/2020 and 12/26/2019. FINDINGS: Lordotic positioning on the  frontal examination. The heart size and mediastinal contours are stable with aortic atherosclerosis. The lungs are clear. No pleural effusion or pneumothorax. No acute osseous findings are evident. There are mild degenerative changes in the thoracic spine. IMPRESSION: No evidence of acute cardiopulmonary process. Aortic atherosclerosis. Electronically Signed   By: Carey Bullocks M.D.   On: 03/27/2023 14:45    Pending Labs Unresulted Labs (From admission, onward)     Start     Ordered   03/28/23 0500  Comprehensive metabolic panel  Tomorrow morning,   R        03/27/23 1938   03/28/23 0500  CBC   Tomorrow morning,   R        03/27/23 1938   03/27/23 1939  Magnesium  Once,   R        03/27/23 1938   03/27/23 1838  Urine Culture (for pregnant, neutropenic or urologic patients or patients with an indwelling urinary catheter)  (Urine Labs)  Add-on,   AD       Question:  Indication  Answer:  Altered mental status (if no other cause identified)   03/27/23 1837   03/27/23 1731  Hepatitis panel, acute  Once,   URGENT        03/27/23 1730            Vitals/Pain Today's Vitals   03/27/23 1800 03/27/23 1900 03/27/23 1951 03/27/23 2000  BP:   (!) 141/87 (!) 155/67  Pulse: 80 88 87 85  Resp: 13 17 17 18   Temp: 98 F (36.7 C) 98.2 F (36.8 C)    TempSrc:  Oral    SpO2: 98% 98% 99% 97%  PainSc:        Isolation Precautions No active isolations  Medications Medications  albuterol (VENTOLIN HFA) 108 (90 Base) MCG/ACT inhaler 2 puff (2 puffs Inhalation Given 03/27/23 1409)  0.9 %  sodium chloride infusion ( Intravenous New Bag/Given 03/27/23 1951)  acetaminophen (TYLENOL) tablet 650 mg (has no administration in time range)    Or  acetaminophen (TYLENOL) suppository 650 mg (has no administration in time range)  ondansetron (ZOFRAN) tablet 4 mg (has no administration in time range)    Or  ondansetron (ZOFRAN) injection 4 mg (has no administration in time range)  sodium chloride 0.9 % bolus 1,000 mL (0 mLs Intravenous Stopped 03/27/23 1510)  sodium chloride 0.9 % bolus 1,000 mL (0 mLs Intravenous Stopped 03/27/23 1900)    Mobility walks     Focused Assessments See Chart   R Recommendations: See Admitting Provider Note  Report given to:   Additional Notes: See Chart

## 2023-03-27 NOTE — Assessment & Plan Note (Signed)
 Blood pressure to goal, appears on on medication  Follow

## 2023-03-27 NOTE — H&P (Addendum)
 History and Physical    Patient: Adam Mathis ZOX:[id number] DOB: October 18, 1945 DOA: 03/27/2023 DOS: the patient was seen and examined on 03/27/2023 PCP: Gwenyth Bender, MD  Patient coming from: Home - lives with his daughter. Ambulates independently.    Chief Complaint: weakness/fall   HPI: Adam Mathis is a 78 y.o. male with medical history significant of HTN, HLD, hx of prostate cancer, OSA not on cpap, scrotal abscess with I&D in January of 2025 who presented to Ed with complaints of four day history of weakness and falls. He has had poor PO intake. His niece gives history. He has been laying around and not really getting out of the bed.  His daughter has had to help get out of the bed which is very unlike him. His niece went to see him today and he was very slow speaking and maybe some confusion. He also fell last night and his daughter had to get him into bed. He did answer questions appropriately, but would go around the questions. He has had no recent illness. He states he has had blurry vision too which is new. This started last night and is still present. He thinks it maybe a little bit better. He also has had some shortness of breath, but states this is better now.   At baseline he is completely independent with all of his ADLs. He cooks, drives, does his finances.   Denies any fever/chills, headaches, chest pain or palpitations, shortness of breath or cough, abdominal pain, N/V/D, dysuria or leg swelling. He denies any neck pain, stiffness or headache.    He does not smoke or drink alcohol.   ER Course:  vitals: afebrile, bp: 131/98, HR: 111, RR: 16, oxygen: 98%RA Pertinent labs: wbc: 10.9, hgb: 12, sodium: 131, BUN: 28, creatinine: 1.78, AST: 90, ALT: 66,  CT head: no acute finding CXR: no acute finding In ED: given 2L IVF bolus and TRH asked to admit.   Review of Systems: As mentioned in the history of present illness. All other systems reviewed and are negative. Past Medical  History:  Diagnosis Date   Degenerative joint disease    History of kidney stones    Age 54   Hyperlipidemia    Hypertension    Prostate cancer (HCC)    Sleep apnea    No CPAP   Vertigo of central origin    Past Surgical History:  Procedure Laterality Date   CARPAL TUNNEL RELEASE  1990   GLAUCOMA SURGERY Left    INCISION AND DRAINAGE ABSCESS N/A 02/19/2023   Procedure: INCISION AND DRAINAGE OF SCROTAL ABSCESS;  Surgeon: Loletta Parish., MD;  Location: [facility];  Service: Urology;  Laterality: N/A;  45 MINUTES NEEDED FOR CASE   ORCHIECTOMY Bilateral 12/12/2021   Procedure: ORCHIECTOMY;  Surgeon: Sebastian Ache, MD;  Location: [facility];  Service: Urology;  Laterality: Bilateral;  1 HR   PROSTATE BIOPSY     TONSILLECTOMY     Social History:  reports that he has never smoked. He has never used smokeless tobacco. He reports that he does not drink alcohol and does not use drugs.  Allergies  Allergen Reactions   Penicillins Hives and Rash   Sulfa Antibiotics Other (See Comments)    Stomach distress    History reviewed. No pertinent family history.  Prior to Admission medications   Medication Sig Start Date End Date Taking? Authorizing Provider  aspirin EC 81 MG tablet Take 81 mg by mouth  daily. Swallow whole.    [provider]  doxazosin (CARDURA) 4 MG tablet Take 4 mg by mouth at bedtime.    [provider]  finasteride (PROSCAR) 5 MG tablet Take 5 mg by mouth daily.    [provider]  HYDROcodone-acetaminophen (NORCO/VICODIN) 5-325 MG tablet Take 1 tablet by mouth every 6 (six) hours as needed for moderate pain (pain score 4-6) or severe pain (pain score 7-10) (post-operativley). 02/19/23 02/19/24  Loletta Parish., MD  loratadine (CLARITIN) 10 MG tablet Take 10 mg by mouth daily. 11/18/17   [provider]  meclizine (ANTIVERT) 12.5 MG tablet Take 1 tablet (12.5 mg total) by mouth 3 (three) times daily as needed for  dizziness. 05/20/16   Long, Arlyss Repress, MD  PRESCRIPTION MEDICATION Eye drops for Glaucoma    [provider]  senna-docusate (SENOKOT-S) 8.6-50 MG tablet Take 1 tablet by mouth 2 (two) times daily. While taking strong pain meds to prevent constipation 12/12/21   Berneice Heinrich Delbert Phenix., MD  sulfamethoxazole-trimethoprim (BACTRIM DS) 800-160 MG tablet Take 1 tablet by mouth daily. 02/19/23   Loletta Parish., MD  timolol (TIMOPTIC) 0.5 % ophthalmic solution PLACE 1 DROP INTO THE RIGHT EYE 2 TIMES DAILY. 10/01/17   [provider]  XTANDI 40 MG capsule  10/18/17   [provider]    Physical Exam: Vitals:   03/27/23 1303 03/27/23 1307 03/27/23 1700 03/27/23 1800  BP:   126/74   Pulse:   86 80  Resp: (!) 25 17 14 13   Temp:    98 F (36.7 C)  TempSrc:      SpO2:   98% 98%   General:  Appears calm and comfortable and is in NAD Eyes:  PERRL, EOMI, normal lids, iris ENT:  grossly normal hearing, lips & tongue, mmm; appropriate dentition Neck:  no LAD, masses or thyromegaly; no carotid bruits. Supple, full ROM  Cardiovascular:  RRR, no m/r/g. No LE edema.  Respiratory:   CTA bilaterally with no wheezes/rales/rhonchi.  Normal respiratory effort. Abdomen:  soft, NT, ND, NABS Back:   normal alignment, no CVAT Skin:  no rash or induration seen on limited exam Musculoskeletal:  grossly normal tone BUE/BLE, good ROM, no bony abnormality Lower extremity:  No LE edema.  Limited foot exam with no ulcerations.  2+ distal pulses. Psychiatric:  grossly normal mood and affect, speech fluent and appropriate, AOx2 Neurologic:  CN 2-12 grossly intact, moves all extremities in coordinated fashion, sensation intact. HTK intact bilaterally. FTN intact with right finger, abnormal with left hand. Pronator drift negative.    Radiological Exams on Admission: Independently reviewed - see discussion in A/P where applicable  CT Head Wo Contrast Result Date: 03/27/2023 CLINICAL DATA:   Mental status change, unknown cause Status post fall with weakness. EXAM: CT HEAD WITHOUT CONTRAST TECHNIQUE: Contiguous axial images were obtained from the base of the skull through the vertex without intravenous contrast. RADIATION DOSE REDUCTION: This exam was performed according to the departmental dose-optimization program which includes automated exposure control, adjustment of the mA and/or kV according to patient size and/or use of iterative reconstruction technique. COMPARISON:  Brain MRI 08/30/2022 FINDINGS: Brain: No intracranial hemorrhage, mass effect, or midline shift. Stable atrophy. No hydrocephalus. The basilar cisterns are patent. Periventricular and deep white matter hypodensity consistent with chronic small vessel ischemia. 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 No evidence of territorial infarct or acute ischemia. No extra-axial or intracranial fluid collection.  Vascular: Atherosclerosis of skullbase vasculature without hyperdense vessel or abnormal calcification. Skull: No fracture or focal lesion. Sinuses/Orbits: No acute finding.  Bilateral cataract resection. Other: None. IMPRESSION: 1.  No acute intracranial abnormality. 2. Stable atrophy and chronic ischemia. Electronically Signed   By: Narda Rutherford M.D.   On: 03/27/2023 17:11   DG Chest 2 View Result Date: 03/27/2023 CLINICAL DATA:  Shortness of breath. Status post fall with weakness, blurry vision and abdominal pain. EXAM: CHEST - 2 VIEW COMPARISON:  Radiographs 11/11/2020 and 12/26/2019. FINDINGS: Lordotic positioning on the frontal examination. The heart size and mediastinal contours are stable with aortic atherosclerosis. The lungs are clear. No pleural effusion or pneumothorax. No acute osseous findings are evident. There are mild degenerative changes in the thoracic spine. IMPRESSION: No evidence of acute cardiopulmonary process. Aortic atherosclerosis. Electronically Signed   By: Carey Bullocks M.D.   On: 03/27/2023 14:45    EKG: Independently reviewed.  Sinus tachycardia with rate 116; nonspecific ST changes with no evidence of acute ischemia. PVC    Labs on Admission: I have personally reviewed the available labs and imaging studies at the time of the admission.  Pertinent labs:   wbc: 10.9,  hgb: 12, sodium: 131,  BUN: 28,  creatinine: 1.78,  AST: 90,  ALT: 66,   Assessment and Plan: Principal Problem:   Acute metabolic encephalopathy with vision changes Active Problems:   AKI (acute kidney injury) (HCC)   Hyponatremia   Transaminitis   Essential hypertension   Hyperlipidemia with target low density lipoprotein (LDL) cholesterol less than 100 mg/dL   Benign prostatic hyperplasia   Prostate cancer metastatic to bone (HCC)    Assessment and Plan: * Acute metabolic encephalopathy with vision changes 78 year old presenting to ED with concerns of poor PO intake and significant weakness with vision changes and new AKI -obs to tele  -no concerning signs of infectious process. Urine culture pending. Hx of scrotal abscess s/p I&D in January. No signs of infection  -CT head negative, but has complaints of vision changes and left eye with poor finger to nose test. Can not hit finger. Hx of eye surgery, but family states this is abnormal. STAT MRI brain. Neuro checks and swallow screen  -hx prostate cancer on xtandi>MRI brain  -check troponin  -metabolic labs  -treat AKI  -check orthostatic vitals  -PT/OT to eval  -fall precautions   AKI (acute kidney injury) (HCC) Likely pre renal in setting of poor PO Intake.  Continue IVF overnight UA with ketones, protein, 0-5 RBC  Avoid nephrotoxic drugs Strict I/O Trend    Hyponatremia Likely hypovolemia hyponatremia Continue IVF overnight  If no improvement, urine studies   Transaminitis Check orthostatics Covid/flu/rsv negative and no hx of recent viral infection  IVF Hepatitis panel pending   Essential  hypertension Blood pressure to goal, appears on on medication  Follow   Hyperlipidemia with target low density lipoprotein (LDL) cholesterol less than 100 mg/dL On no statin   Benign prostatic hyperplasia Hold proscar until orthostatic done   Prostate cancer metastatic to bone (HCC) Diagnosed in 2019.  orchiectomy 2023, androgen deprivation. No other therapy.  Most Recent Restaging - 06/2021 CT, BS - stable T11 mets      Advance Care Planning:   Code Status: Full Code   Consults: PT/OT   DVT Prophylaxis: SCD/TED hose   Family Communication: daughter and niece at bedside   Severity of Illness: The appropriate patient status for this patient is OBSERVATION.  Observation status is judged to be reasonable and necessary in order to provide the required intensity of service to ensure the patient's safety. The patient's presenting symptoms, physical exam findings, and initial radiographic and laboratory data in the context of their medical condition is felt to place them at decreased risk for further clinical deterioration. Furthermore, it is anticipated that the patient will be medically stable for discharge from the hospital within 2 midnights of admission.   Author: Orland Mustard, MD 03/27/2023 7:46 PM  For on call review [email or web address].

## 2023-03-27 NOTE — Assessment & Plan Note (Signed)
 Check orthostatics Covid/flu/rsv negative and no hx of recent viral infection  IVF Hepatitis panel pending

## 2023-03-27 NOTE — Assessment & Plan Note (Addendum)
 Likely pre renal in setting of poor PO Intake.  Continue IVF overnight UA with ketones, protein, 0-5 RBC  Avoid nephrotoxic drugs Strict I/O Trend

## 2023-03-27 NOTE — ED Provider Notes (Signed)
 [place] EMERGENCY DEPARTMENT AT [facility] Provider Note   CSN: [id number] Arrival date & time: 03/27/23  1240     History  Chief Complaint  Patient presents with   Weakness   Fall   Abdominal Pain   Shortness of Breath    Adam Mathis is a 78 y.o. male.   Weakness Associated symptoms: abdominal pain and shortness of breath   Fall Associated symptoms include abdominal pain and shortness of breath.  Abdominal Pain Associated symptoms: shortness of breath   Shortness of Breath Associated symptoms: abdominal pain   Patient with weakness.  Generalized.  Reportedly 3 to 4 days ago has been eating and drinking less.  More confusion per friend with him.  Patient states he just feels bad.  Denies dysuria.  States he had a fall because he was feeling weak.  Normally awake and conversant now more slow to answer.    Past Medical History:  Diagnosis Date   Degenerative joint disease    History of kidney stones    Age 35   Hyperlipidemia    Hypertension    Prostate cancer (HCC)    Sleep apnea    No CPAP   Vertigo of central origin     migraine headache Home Medications Prior to Admission medications   Medication Sig Start Date End Date Taking? Authorizing Provider  aspirin EC 81 MG tablet Take 81 mg by mouth daily. Swallow whole.    [provider]  doxazosin (CARDURA) 4 MG tablet Take 4 mg by mouth at bedtime.    [provider]  finasteride (PROSCAR) 5 MG tablet Take 5 mg by mouth daily.    [provider]  HYDROcodone-acetaminophen (NORCO/VICODIN) 5-325 MG tablet Take 1 tablet by mouth every 6 (six) hours as needed for moderate pain (pain score 4-6) or severe pain (pain score 7-10) (post-operativley). 02/19/23 02/19/24  Loletta Parish., MD  loratadine (CLARITIN) 10 MG tablet Take 10 mg by mouth daily. 11/18/17   [provider]  meclizine (ANTIVERT) 12.5 MG tablet Take 1 tablet (12.5 mg total) by mouth 3 (three) times  daily as needed for dizziness. 05/20/16   Long, Arlyss Repress, MD  PRESCRIPTION MEDICATION Eye drops for Glaucoma    [provider]  senna-docusate (SENOKOT-S) 8.6-50 MG tablet Take 1 tablet by mouth 2 (two) times daily. While taking strong pain meds to prevent constipation 12/12/21   Berneice Heinrich Delbert Phenix., MD  sulfamethoxazole-trimethoprim (BACTRIM DS) 800-160 MG tablet Take 1 tablet by mouth daily. 02/19/23   Loletta Parish., MD  timolol (TIMOPTIC) 0.5 % ophthalmic solution PLACE 1 DROP INTO THE RIGHT EYE 2 TIMES DAILY. 10/01/17   [provider]  XTANDI 40 MG capsule  10/18/17   [provider]      Allergies    Patient has no known allergies.    Review of Systems   Review of Systems  Respiratory:  Positive for shortness of breath.   Gastrointestinal:  Positive for abdominal pain.  Neurological:  Positive for weakness.    Physical Exam Updated Vital Signs BP (!) 131/98 (BP Location: Left Arm)   Pulse (!) 111   Temp 98.2 F (36.8 C) (Oral)   Resp 17   SpO2 98%  Physical Exam Vitals and nursing note reviewed.  Cardiovascular:     Rate and Rhythm: Normal rate.  Pulmonary:     Breath sounds: Normal breath sounds.  Abdominal:     Tenderness: There is  no abdominal tenderness.  Genitourinary:    Comments: No scrotal tenderness.  No testicles palpated.  No abscess palpated. Skin:    Capillary Refill: Capillary refill takes less than 2 seconds.  Neurological:     Mental Status: He is alert.     ED Results / Procedures / Treatments   Labs (all labs ordered are listed, but only abnormal results are displayed) Labs Reviewed  BASIC METABOLIC PANEL - Abnormal; Notable for the following components:      Result Value   Sodium 131 (*)    Potassium 3.4 (*)    CO2 19 (*)    Glucose, Bld 150 (*)    BUN 28 (*)    Creatinine, Ser 1.78 (*)    GFR, Estimated 39 (*)    All other components within normal limits  CBC - Abnormal; Notable for the following  components:   WBC 10.9 (*)    RBC 3.95 (*)    Hemoglobin 12.0 (*)    HCT 35.5 (*)    All other components within normal limits  HEPATIC FUNCTION PANEL - Abnormal; Notable for the following components:   AST 90 (*)    ALT 66 (*)    Bilirubin, Direct 0.3 (*)    All other components within normal limits  CBG MONITORING, ED - Abnormal; Notable for the following components:   Glucose-Capillary 134 (*)    All other components within normal limits  RESP PANEL BY RT-PCR (RSV, FLU A&B, COVID)  RVPGX2  LIPASE, BLOOD  ETHANOL  URINALYSIS, ROUTINE W REFLEX MICROSCOPIC  RAPID URINE DRUG SCREEN, HOSP PERFORMED  CBG MONITORING, ED    EKG EKG Interpretation Date/Time:  Saturday March 27 2023 12:53:01 EST Ventricular Rate:  116 PR Interval:  145 QRS Duration:  85 QT Interval:  301 QTC Calculation: 419 R Axis:   83  Text Interpretation: Sinus tachycardia Multiform ventricular premature complexes Consider right atrial enlargement Confirmed by Alvester Chou [phone]) on 03/27/2023 1:07:23 PM  Radiology DG Chest 2 View Result Date: 03/27/2023 CLINICAL DATA:  Shortness of breath. Status post fall with weakness, blurry vision and abdominal pain. EXAM: CHEST - 2 VIEW COMPARISON:  Radiographs 11/11/2020 and 12/26/2019. FINDINGS: Lordotic positioning on the frontal examination. The heart size and mediastinal contours are stable with aortic atherosclerosis. The lungs are clear. No pleural effusion or pneumothorax. No acute osseous findings are evident. There are mild degenerative changes in the thoracic spine. IMPRESSION: No evidence of acute cardiopulmonary process. Aortic atherosclerosis. Electronically Signed   By: Carey Bullocks M.D.   On: 03/27/2023 14:45    Procedures Procedures    Medications Ordered in ED Medications  albuterol (VENTOLIN HFA) 108 (90 Base) MCG/ACT inhaler 2 puff (2 puffs Inhalation Given 03/27/23 1409)  sodium chloride 0.9 % bolus 1,000 mL (1,000 mLs Intravenous New Bag/Given  03/27/23 1409)    ED Course/ Medical Decision Making/ A&P                                 Medical Decision Making Amount and/or Complexity of Data Reviewed Labs: ordered. Radiology: ordered.  Risk Prescription drug management.   Patient fatigue, decreased oral intake and some mental status change.  Reportedly had a fall.  Differential diagnosis includes infection, dehydration, encephalopathy.  Also traumatic injury.  Benign abdominal exam.  Scrotum evaluated since he had recent incision drainage and did not see recurrent infection at this time.  Basic blood work done  and shows creatinine increased from 1.08 up to 1.78.  Fluid boluses been given.  Urinalysis still pending.  Likely require admission to hospital with acute kidney injury and mental status change.  Care turned over to Dr Rhunette Croft        Final Clinical Impression(s) / ED Diagnoses Final diagnoses:  Dehydration  Encephalopathy, unspecified type    Rx / DC Orders ED Discharge Orders     None         Benjiman Core, MD 03/27/23 1513

## 2023-03-27 NOTE — ED Notes (Signed)
 Patient transported to MRI

## 2023-03-27 NOTE — Assessment & Plan Note (Signed)
 On no statin

## 2023-03-27 NOTE — ED Triage Notes (Signed)
 Patient presents post fall with weakness, blurry and abdominal pain. He is normally very active, but now needs help getting out of bed. Last night he fell. Weakness began 3-4 days ago. Low PO intake. He recently had a procedure done 02/16/2023 and was told to come here by his MD.

## 2023-03-27 NOTE — Assessment & Plan Note (Signed)
 78 year old presenting to ED with concerns of poor PO intake and significant weakness with vision changes and new AKI -obs to tele  -no concerning signs of infectious process. Urine culture pending. Hx of scrotal abscess s/p I&D in January. No signs of infection  -CT head negative, but has complaints of vision changes and left eye with poor finger to nose test. Can not hit finger. Hx of eye surgery, but family states this is abnormal. STAT MRI brain. Neuro checks and swallow screen  -hx prostate cancer on xtandi>MRI brain  -check troponin  -metabolic labs  -treat AKI  -check orthostatic vitals  -PT/OT to eval  -fall precautions

## 2023-03-27 NOTE — ED Provider Notes (Addendum)
  Physical Exam  BP (!) 131/98 (BP Location: Left Arm)   Pulse (!) 111   Temp 98.2 F (36.8 C) (Oral)   Resp 17   SpO2 98%   Physical Exam  Procedures  Procedures  ED Course / MDM    Medical Decision Making Amount and/or Complexity of Data Reviewed Labs: ordered. Radiology: ordered.  Risk Prescription drug management.   Pt comes in with cc of htn, hl and a recent hx of scrotal abscess comes in with cc of fall, weakness, abd pain.  Per Dr. Rubin Payor pt has AKI, mild LFT elevation.  Patient has no abdominal pain, and scrotal evaluation was normal.  Plan is to admit. UA pending.   4:24 PM I reassessed the patient.  UA pending at this time.  Patient's family indicates that patient has been not himself for the last 4 days.  He has been weak, sluggish.  Has had couple of falls.  Currently patient has no headache, neck pain and has no meningismus.  Patient is oriented x 3.  He indicates that he has reduced p.o. intake.  No fevers.  UA has been sent.  I have added CT head. Will continue with the plan for admission once workup completed.  Patient and family have no complaints or concerns at this time.    Derwood Kaplan, MD 03/27/23 [phone]

## 2023-03-28 DIAGNOSIS — G9341 Metabolic encephalopathy: Principal | ICD-10-CM

## 2023-03-28 LAB — COMPREHENSIVE METABOLIC PANEL
ALT: 71 U/L — ABNORMAL HIGH (ref 0–44)
AST: 76 U/L — ABNORMAL HIGH (ref 15–41)
Albumin: 2.8 g/dL — ABNORMAL LOW (ref 3.5–5.0)
Alkaline Phosphatase: 54 U/L (ref 38–126)
Anion gap: 10 (ref 5–15)
BUN: 21 mg/dL (ref 8–23)
CO2: 18 mmol/L — ABNORMAL LOW (ref 22–32)
Calcium: 8.3 mg/dL — ABNORMAL LOW (ref 8.9–10.3)
Chloride: 104 mmol/L (ref 98–111)
Creatinine, Ser: 1.24 mg/dL (ref 0.61–1.24)
GFR, Estimated: 60 mL/min — ABNORMAL LOW (ref >60)
Glucose, Bld: 130 mg/dL — ABNORMAL HIGH (ref 70–99)
Potassium: 3.1 mmol/L — ABNORMAL LOW (ref 3.5–5.1)
Sodium: 132 mmol/L — ABNORMAL LOW (ref 135–145)
Total Bilirubin: 0.6 mg/dL (ref 0.0–1.2)
Total Protein: 6.4 g/dL — ABNORMAL LOW (ref 6.5–8.1)

## 2023-03-28 LAB — CBC
HCT: 31.3 % — ABNORMAL LOW (ref 39.0–52.0)
Hemoglobin: 10.2 g/dL — ABNORMAL LOW (ref 13.0–17.0)
MCH: 30.2 pg (ref 26.0–34.0)
MCHC: 32.6 g/dL (ref 30.0–36.0)
MCV: 92.6 fL (ref 80.0–100.0)
Platelets: 146 10*3/uL — ABNORMAL LOW (ref 150–400)
RBC: 3.38 MIL/uL — ABNORMAL LOW (ref 4.22–5.81)
RDW: 14 % (ref 11.5–15.5)
WBC: 8.1 10*3/uL (ref 4.0–10.5)
nRBC: 0 % (ref 0.0–0.2)

## 2023-03-28 LAB — HEPATITIS PANEL, ACUTE
HCV Ab: NONREACTIVE
Hep A IgM: NONREACTIVE
Hep B C IgM: NONREACTIVE
Hepatitis B Surface Ag: NONREACTIVE

## 2023-03-28 MED ORDER — TIMOLOL MALEATE 0.5 % OP SOLN
1.0000 [drp] | Freq: Two times a day (BID) | OPHTHALMIC | Status: DC
  Administered 2023-03-28: 1 [drp] via OPHTHALMIC
  Filled 2023-03-28: qty 5

## 2023-03-28 MED ORDER — ASPIRIN 81 MG PO TBEC
81.0000 mg | DELAYED_RELEASE_TABLET | Freq: Every day | ORAL | Status: DC
  Administered 2023-03-28 – 2023-03-29 (×2): 81 mg via ORAL
  Filled 2023-03-28 (×2): qty 1

## 2023-03-28 MED ORDER — ATENOLOL 25 MG PO TABS
25.0000 mg | ORAL_TABLET | Freq: Every day | ORAL | Status: DC
  Administered 2023-03-28 – 2023-03-29 (×2): 25 mg via ORAL
  Filled 2023-03-28 (×2): qty 1

## 2023-03-28 MED ORDER — DOXAZOSIN MESYLATE 4 MG PO TABS
4.0000 mg | ORAL_TABLET | Freq: Every day | ORAL | Status: DC
Start: 2023-03-28 — End: 2023-03-29
  Administered 2023-03-28: 4 mg via ORAL
  Filled 2023-03-28: qty 1

## 2023-03-28 MED ORDER — FINASTERIDE 5 MG PO TABS
5.0000 mg | ORAL_TABLET | Freq: Every day | ORAL | Status: DC
  Administered 2023-03-29: 5 mg via ORAL
  Filled 2023-03-28: qty 1

## 2023-03-28 MED ORDER — SENNOSIDES-DOCUSATE SODIUM 8.6-50 MG PO TABS
1.0000 | ORAL_TABLET | Freq: Two times a day (BID) | ORAL | Status: DC
  Administered 2023-03-28 – 2023-03-29 (×2): 1 via ORAL
  Filled 2023-03-28 (×2): qty 1

## 2023-03-28 MED ORDER — HYDROCODONE-ACETAMINOPHEN 5-325 MG PO TABS: 1.0000 | ORAL_TABLET | Freq: Four times a day (QID) | ORAL | Status: DC | PRN

## 2023-03-28 MED ORDER — ROSUVASTATIN CALCIUM 5 MG PO TABS
5.0000 mg | ORAL_TABLET | Freq: Every day | ORAL | Status: DC
  Administered 2023-03-28: 5 mg via ORAL
  Filled 2023-03-28: qty 1

## 2023-03-28 MED ORDER — POTASSIUM CHLORIDE CRYS ER 20 MEQ PO TBCR
40.0000 meq | EXTENDED_RELEASE_TABLET | ORAL | Status: AC
Start: 2023-03-28 — End: 2023-03-28
  Administered 2023-03-28 (×2): 40 meq via ORAL
  Filled 2023-03-28 (×2): qty 2

## 2023-03-28 MED ORDER — SULFAMETHOXAZOLE-TRIMETHOPRIM 800-160 MG PO TABS: 1.0000 | ORAL_TABLET | Freq: Every day | ORAL | Status: DC

## 2023-03-28 NOTE — Care Management Obs Status (Signed)
 MEDICARE OBSERVATION STATUS NOTIFICATION   Patient Details  Name: Adam Mathis MRN: [id number] Date of Birth: 1945/05/17   Medicare Observation Status Notification Given:  Yes    Adrian Prows, RN 03/28/2023, 11:59 AM

## 2023-03-28 NOTE — Evaluation (Signed)
 Physical Therapy Evaluation Patient Details Name: Adam Mathis MRN: [id number] DOB: [birth year] Today's Date: 03/28/2023  History of Present Illness  Adam Mathis is a 78 y.o. male admit with c/o of weakness, slow speech, confusion and blurred vision over past few days possibly metabolic encephalopathy.  PMH:  HTN, HLD, hx of prostate cancer, OSA not on cpap, scrotal abscess with I&D in January of 2025  Clinical Impression  Pt very pleasant and agreeable to get out of bed. Hungry and tray had arrived and he was not aware. Pt was very slow with movement patterns for basic mobility, supine to sit and sit to stand, as well as walking. Has generalized weakness , however unsure if cognitive status is baseline or this is new. His dtr was not present.  After we ambulated pt did seem slow to assess where his napkin and silverware was on his tray , and as I returned with some items had spilled quite a bit of food off his plate that he was unaware of. I also returned later and he had not eaten much, but did reference " it just does not taste good".  Dtr not present but seems from [facility] note they are happy to get him back home. He may need a RW until his strength returns and possible HHPT. Will continue to follow and I will have our mobility team follow as well.       If plan is discharge home, recommend the following: A little help with walking and/or transfers;A little help with bathing/dressing/bathroom   Can travel by private vehicle        Equipment Recommendations Rolling walker (2 wheels)  Recommendations for Other Services       Functional Status Assessment Patient has had a recent decline in their functional status and demonstrates the ability to make significant improvements in function in a reasonable and predictable amount of time.     Precautions / Restrictions Precautions Precautions: Fall      Mobility  Bed Mobility Overal bed mobility: Needs Assistance Bed Mobility: Supine to  Sit, Sit to Supine     Supine to sit: Min assist     General bed mobility comments: min assist with scooting and used raila nd gave him a hand to pull his upper body to sitting. Very slow to get to this postioning sitting edge of bed.    Transfers Overall transfer level: Needs assistance Equipment used: Rolling walker (2 wheels) Transfers: Sit to/from Stand Sit to Stand: Min assist           General transfer comment: min A to steady once standing was not fully stable.    Ambulation/Gait Ambulation/Gait assistance: Contact guard assist Gait Distance (Feet): 25 Feet Assistive device: Rolling walker (2 wheels) Gait Pattern/deviations: Step-through pattern       General Gait Details: slow and little difficulty with RW manuvering. was little weak so just ambulated in room at this time. may need RW for now until stronger.  Stairs            Wheelchair Mobility     Tilt Bed    Modified Rankin (Stroke Patients Only)       Balance Overall balance assessment: Mild deficits observed, not formally tested, Needs assistance Sitting-balance support: Bilateral upper extremity supported, Feet supported Sitting balance-Leahy Scale: Fair     Standing balance support: Bilateral upper extremity supported, During functional activity Standing balance-Leahy Scale: Fair  Pertinent Vitals/Pain Pain Assessment Pain Assessment: No/denies pain    Home Living Family/patient expects to be discharged to:: Private residence Living Arrangements: Children Available Help at Discharge: Family;Available PRN/intermittently Type of Home: House Home Access: Stairs to enter   [employer] of Steps: unsure ?     Home Equipment: None Additional Comments: pt was not completely sure about these home arrangment and questions. He was very quite and did not have many answers at this time. Will need to check with dtr to see if this cognitive  state is differrent from baseline.    Prior Function Prior Level of Function : Independent/Modified Independent             Mobility Comments: Pt reported he walked with no assistive device, drives, adn very active, just have not felt well and in bed more than normal so he states he is feelign weak.       Extremity/Trunk Assessment        Lower Extremity Assessment Lower Extremity Assessment: Generalized weakness       Communication   Communication Communication: Other (comment) (1 -2 word answers, slow to respond)    Cognition Arousal: Lethargic Behavior During Therapy: Flat affect (very slow to respond and very few words. Unsure if this is his baseline, not family present)   PT - Cognitive impairments: No family/caregiver present to determine baseline                         Following commands: Intact       Cueing Cueing Techniques: Verbal cues, Tactile cues, Gestural cues     General Comments      Exercises     Assessment/Plan    PT Assessment Patient needs continued PT services  PT Problem List Decreased strength;Decreased mobility;Decreased range of motion;Decreased activity tolerance       PT Treatment Interventions DME instruction;Therapeutic exercise;Gait training;Functional mobility training;Therapeutic activities;Patient/family education    PT Goals (Current goals can be found in the Care Plan section)  Acute Rehab PT Goals Patient Stated Goal: I want to get stronger , I feel weak and cold PT Goal Formulation: With patient Time For Goal Achievement: 04/11/23 Potential to Achieve Goals: Good    Frequency Min 3X/week     Co-evaluation               AM-PAC PT "6 Clicks" Mobility  Outcome Measure Help needed turning from your back to your side while in a flat bed without using bedrails?: A Little Help needed moving from lying on your back to sitting on the side of a flat bed without using bedrails?: A Little Help needed  moving to and from a bed to a chair (including a wheelchair)?: A Little Help needed standing up from a chair using your arms (e.g., wheelchair or bedside chair)?: A Little Help needed to walk in hospital room?: A Little Help needed climbing 3-5 steps with a railing? : A Little 6 Click Score: 18    End of Session Equipment Utilized During Treatment: Gait belt Activity Tolerance: Patient tolerated treatment well Patient left: in chair;with call bell/phone within reach;with chair alarm set Nurse Communication: Mobility status PT Visit Diagnosis: Unsteadiness on feet (R26.81);Muscle weakness (generalized) (M62.81)    Time: 1440-1510 PT Time Calculation (min) (ACUTE ONLY): 30 min   Charges:   PT Evaluation $PT Eval Low Complexity: 1 Low PT Treatments $Gait Training: 8-22 mins PT General Charges $$ ACUTE PT VISIT: 1 Visit  Adam Mathis, PT, MPT Acute Rehabilitation Services Office: [phone] If a weekend: secure chat groups: [facility] PT, [facility] OT, [facility] SLP 03/28/2023   Adam Mathis 03/28/2023, 5:22 PM

## 2023-03-28 NOTE — Progress Notes (Signed)
 PROGRESS NOTE    BLONG BUSK  JXB:[id number] DOB: 1945-08-24 DOA: 03/27/2023 PCP: Gwenyth Bender, MD   Brief Narrative:  HPI: Adam Mathis is a 78 y.o. male with medical history significant of HTN, HLD, hx of prostate cancer, OSA not on cpap, scrotal abscess with I&D in January of 2025 who presented to Ed with complaints of four day history of weakness and falls. He has had poor PO intake. His niece gives history. He has been laying around and not really getting out of the bed.  His daughter has had to help get out of the bed which is very unlike him. His niece went to see him today and he was very slow speaking and maybe some confusion. He also fell last night and his daughter had to get him into bed. He did answer questions appropriately, but would go around the questions. He has had no recent illness. He states he has had blurry vision too which is new. This started last night and is still present. He thinks it maybe a little bit better. He also has had some shortness of breath, but states this is better now.    At baseline he is completely independent with all of his ADLs. He cooks, drives, does his finances.    Denies any fever/chills, headaches, chest pain or palpitations, shortness of breath or cough, abdominal pain, N/V/D, dysuria or leg swelling. He denies any neck pain, stiffness or headache.      He does not smoke or drink alcohol.    ER Course:  vitals: afebrile, bp: 131/98, HR: 111, RR: 16, oxygen: 98%RA Pertinent labs: wbc: 10.9, hgb: 12, sodium: 131, BUN: 28, creatinine: 1.78, AST: 90, ALT: 66,  CT head: no acute finding CXR: no acute finding In ED: given 2L IVF bolus and TRH asked to admit.   Assessment & Plan:   Principal Problem:   Acute metabolic encephalopathy with vision changes Active Problems:   AKI (acute kidney injury) (HCC)   Hyponatremia   Transaminitis   Essential hypertension   Hyperlipidemia with target low density lipoprotein (LDL) cholesterol less  than 100 mg/dL   Benign prostatic hyperplasia   Prostate cancer metastatic to bone (HCC)  Acute metabolic encephalopathy with vision changes, POA/fever, not POA: -no concerning signs of infectious process upon admission however he has now developed fever of 101.  UA unremarkable, urine culture pending. Hx of scrotal abscess s/p I&D in January. CT head followed by MRI brain negative for acute pathology.  Will need another day of observation due to fever spike.  Otherwise he is fully alert and oriented now, has no more complaints of blurry vision and remembers the events from yesterday.   AKI (acute kidney injury) (HCC)/dehydration Likely pre renal in setting of poor PO Intake, resolved with IV fluids which is ongoing and will stop around 7 PM today.   Hyponatremia Presented with 131, likely hypovolemia hyponatremia, received IV fluids, improving to 132 today.  Hypokalemia: 3.1.  Replenish.   Transaminitis/elevated LFTs: Viral hepatitis panel negative.  AST slightly elevated than ALT, suspecting alcoholic hepatitis.  Overall improving LFTs.   Essential hypertension Blood pressure to goal, appears on no medication    Benign prostatic hyperplasia Resume home medications.   Prostate cancer metastatic to bone [facility]) Diagnosed in 2019.  orchiectomy 2023, androgen deprivation. No other therapy.  Most Recent Restaging - 06/2021 CT, BS - stable T11 mets   Generalized weakness: Patient complains of generalized weakness and no any focal weakness  since yesterday.  PT OT assessment is requested.  DVT prophylaxis: Place TED hose Start: 03/27/23 1941 SCDs Start: 03/27/23 1937   Code Status: Full Code  Family Communication:  None present at bedside.  Plan of care discussed with patient in length and he/she verbalized understanding and agreed with it.  Status is: Observation The patient will require care spanning > 2 midnights and should be moved to inpatient because: Spiked fever, needs observation  and assessment by PT OT.   Estimated body mass index is 26.31 kg/m as calculated from the following:   Height as of this encounter: 6' (1.829 m).   Weight as of this encounter: 88 kg.    Nutritional Assessment: Body mass index is 26.31 kg/m.Marland Kitchen Seen by dietician.  I agree with the assessment and plan as outlined below: Nutrition Status:        . Skin Assessment: I have examined the patient's skin and I agree with the wound assessment as performed by the wound care RN as outlined below:    Consultants:  None  Procedures:  None  Antimicrobials:  Anti-infectives (From admission, onward)    Start     Dose/Rate Route Frequency Ordered Stop   03/28/23 1000  sulfamethoxazole-trimethoprim (BACTRIM DS) 800-160 MG per tablet 1 tablet        1 tablet Oral Daily 03/28/23 0843           Subjective: Patient seen and examined, he is fully alert and oriented but feels very weak and appears very weak and slightly dehydrated as well.  Denies any more visual changes or any other complaint.  Objective: Vitals:   03/27/23 2132 03/27/23 2136 03/28/23 0131 03/28/23 0454  BP: (!) 141/71  (!) 147/67 130/61  Pulse: 79  81 76  Resp: 18  18 18   Temp: 99.7 F (37.6 C)  98.7 F (37.1 C) (!) 101 F (38.3 C)  TempSrc: Oral  Oral Oral  SpO2: 100%  97% 99%  Weight:  88 kg    Height:  6' (1.829 m)      Intake/Output Summary (Last 24 hours) at 03/28/2023 0843 Last data filed at 03/28/2023 0500 Gross per 24 hour  Intake 851.25 ml  Output 1000 ml  Net -148.75 ml   Filed Weights   03/27/23 2136  Weight: 88 kg    Examination:  General exam: Appears calm and comfortable but weak and slightly dehydrated Respiratory system: Clear to auscultation. Respiratory effort normal. Cardiovascular system: S1 & S2 heard, RRR. No JVD, murmurs, rubs, gallops or clicks. No pedal edema. Gastrointestinal system: Abdomen is nondistended, soft and nontender. No organomegaly or masses felt. Normal bowel  sounds heard. Central nervous system: Alert and oriented. No focal neurological deficits. Extremities: Symmetric 5 x 5 power. Skin: No rashes, lesions or ulcers Psychiatry: Judgement and insight appear normal. Mood & affect appropriate.    Data Reviewed: I have personally reviewed following labs and imaging studies  CBC: Recent Labs  Lab 03/27/23 1315 03/28/23 0613  WBC 10.9* 8.1  HGB 12.0* 10.2*  HCT 35.5* 31.3*  MCV 89.9 92.6  PLT 162 146*   Basic Metabolic Panel: Recent Labs  Lab 03/27/23 1315 03/27/23 2019 03/28/23 0613  NA 131*  --  132*  K 3.4*  --  3.1*  CL 98  --  104  CO2 19*  --  18*  GLUCOSE 150*  --  130*  BUN 28*  --  21  CREATININE 1.78*  --  1.24  CALCIUM 9.1  --  8.3*  MG  --  2.1  --    GFR: Estimated Creatinine Clearance: 53.9 mL/min (by C-G formula based on SCr of 1.24 mg/dL). Liver Function Tests: Recent Labs  Lab 03/27/23 1402 03/28/23 0613  AST 90* 76*  ALT 66* 71*  ALKPHOS 62 54  BILITOT 1.0 0.6  PROT 8.0 6.4*  ALBUMIN 3.6 2.8*   Recent Labs  Lab 03/27/23 1315  LIPASE 51   Recent Labs  Lab 03/27/23 1731  AMMONIA 11   Coagulation Profile: No results for input(s): "INR", "PROTIME" in the last 168 hours. Cardiac Enzymes: No results for input(s): "CKTOTAL", "CKMB", "CKMBINDEX", "TROPONINI" in the last 168 hours. BNP (last 3 results) No results for input(s): "PROBNP" in the last 8760 hours. HbA1C: No results for input(s): "HGBA1C" in the last 72 hours. CBG: Recent Labs  Lab 03/27/23 1257  GLUCAP 134*   Lipid Profile: No results for input(s): "CHOL", "HDL", "LDLCALC", "TRIG", "CHOLHDL", "LDLDIRECT" in the last 72 hours. Thyroid Function Tests: Recent Labs    03/27/23 1800  TSH 1.478   Anemia Panel: Recent Labs    03/27/23 1800  VITAMINB12 432   Sepsis Labs: No results for input(s): "PROCALCITON", "LATICACIDVEN" in the last 168 hours.  Recent Results (from the past 240 hours)  Resp panel by RT-PCR (RSV, Flu  A&B, Covid) Anterior Nasal Swab     Status: None   Collection Time: 03/27/23  2:02 PM   Specimen: Anterior Nasal Swab  Result Value Ref Range Status   SARS Coronavirus 2 by RT PCR NEGATIVE NEGATIVE Final    Comment: (NOTE) SARS-CoV-2 target nucleic acids are NOT DETECTED.  The SARS-CoV-2 RNA is generally detectable in upper respiratory specimens during the acute phase of infection. The lowest concentration of SARS-CoV-2 viral copies this assay can detect is 138 copies/mL. A negative result does not preclude SARS-Cov-2 infection and should not be used as the sole basis for treatment or other patient management decisions. A negative result may occur with  improper specimen collection/handling, submission of specimen other than nasopharyngeal swab, presence of viral mutation(s) within the areas targeted by this assay, and inadequate number of viral copies(<138 copies/mL). A negative result must be combined with clinical observations, patient history, and epidemiological information. The expected result is Negative.  Fact Sheet for Patients:  BloggerCourse.com  Fact Sheet for Healthcare Providers:  SeriousBroker.it  This test is no t yet approved or cleared by the [place] FDA and  has been authorized for detection and/or diagnosis of SARS-CoV-2 by FDA under an Emergency Use Authorization (EUA). This EUA will remain  in effect (meaning this test can be used) for the duration of the COVID-19 declaration under Section 564(b)(1) of the Act, 21 U.S.C.section 360bbb-3(b)(1), unless the authorization is terminated  or revoked sooner.       Influenza A by PCR NEGATIVE NEGATIVE Final   Influenza B by PCR NEGATIVE NEGATIVE Final    Comment: (NOTE) The Xpert Xpress SARS-CoV-2/FLU/RSV plus assay is intended as an aid in the diagnosis of influenza from Nasopharyngeal swab specimens and should not be used as a sole basis for treatment.  Nasal washings and aspirates are unacceptable for Xpert Xpress SARS-CoV-2/FLU/RSV testing.  Fact Sheet for Patients: BloggerCourse.com  Fact Sheet for Healthcare Providers: SeriousBroker.it  This test is not yet approved or cleared by the [place] FDA and has been authorized for detection and/or diagnosis of SARS-CoV-2 by FDA under an Emergency Use Authorization (EUA). This EUA will remain in effect (meaning this test  can be used) for the duration of the COVID-19 declaration under Section 564(b)(1) of the Act, 21 U.S.C. section 360bbb-3(b)(1), unless the authorization is terminated or revoked.     Resp Syncytial Virus by PCR NEGATIVE NEGATIVE Final    Comment: (NOTE) Fact Sheet for Patients: BloggerCourse.com  Fact Sheet for Healthcare Providers: SeriousBroker.it  This test is not yet approved or cleared by the [place] FDA and has been authorized for detection and/or diagnosis of SARS-CoV-2 by FDA under an Emergency Use Authorization (EUA). This EUA will remain in effect (meaning this test can be used) for the duration of the COVID-19 declaration under Section 564(b)(1) of the Act, 21 U.S.C. section 360bbb-3(b)(1), unless the authorization is terminated or revoked.  Performed at [facility], [address] [place], [place] [address]      Radiology Studies: MR BRAIN WO CONTRAST Result Date: 03/27/2023 CLINICAL DATA:  Initial evaluation for acute neuro deficit, stroke suspected EXAM: MRI HEAD WITHOUT CONTRAST TECHNIQUE: Multiplanar, multiecho pulse sequences of the brain and surrounding structures were obtained without intravenous contrast. COMPARISON:  CT from earlier the same day. FINDINGS: Brain: Cerebral volume within normal limits. Patchy T2/FLAIR hyperintensity involving the periventricular deep white matter both cerebral hemispheres as well as  the pons, consistent with chronic small vessel ischemic disease, mild to moderate in nature. Few scattered remote lacunar infarcts present about the left greater than right basal ganglia/corona radiata. No abnormal foci of restricted diffusion to suggest acute or subacute ischemia. Gray-white matter differentiation maintained. No acute intracranial hemorrhage. Small focus of chronic hemosiderin staining noted at the right parieto-occipital region. No other significant chronic intracranial blood products. No mass lesion, midline shift or mass effect. No hydrocephalus or extra-axial fluid collection. Pituitary gland within normal limits. Vascular: Major intracranial vascular flow voids are maintained. Skull and upper cervical spine: Craniocervical junction within normal limits. Bone marrow signal intensity normal. No scalp soft tissue abnormality. Sinuses/Orbits: Prior bilateral ocular lens replacement. Paranasal sinuses are clear. Trace left mastoid effusion, of doubtful significance. Other: None. IMPRESSION: 1. No acute intracranial abnormality. 2. Mild to moderate chronic microvascular ischemic disease with a few scattered remote lacunar infarcts about the left greater than right basal ganglia/corona radiata. Electronically Signed   By: Rise Mu M.D.   On: 03/27/2023 23:02   CT Head Wo Contrast Result Date: 03/27/2023 CLINICAL DATA:  Mental status change, unknown cause Status post fall with weakness. EXAM: CT HEAD WITHOUT CONTRAST TECHNIQUE: Contiguous axial images were obtained from the base of the skull through the vertex without intravenous contrast. RADIATION DOSE REDUCTION: This exam was performed according to the departmental dose-optimization program which includes automated exposure control, adjustment of the mA and/or kV according to patient size and/or use of iterative reconstruction technique. COMPARISON:  Brain MRI 08/30/2022 FINDINGS: Brain: No intracranial hemorrhage, mass effect, or  midline shift. Stable atrophy. No hydrocephalus. The basilar cisterns are patent. Periventricular and deep white matter hypodensity consistent with chronic small vessel ischemia. 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 No evidence of territorial infarct or acute ischemia. No extra-axial or intracranial fluid collection. Vascular: Atherosclerosis of skullbase vasculature without hyperdense vessel or abnormal calcification. Skull: No fracture or focal lesion. Sinuses/Orbits: No acute finding.  Bilateral cataract resection. Other: None. IMPRESSION: 1.  No acute intracranial abnormality. 2. Stable atrophy and chronic ischemia. Electronically Signed   By: Narda Rutherford M.D.   On: 03/27/2023 17:11   DG Chest 2 View Result Date: 03/27/2023 CLINICAL DATA:  Shortness  of breath. Status post fall with weakness, blurry vision and abdominal pain. EXAM: CHEST - 2 VIEW COMPARISON:  Radiographs 11/11/2020 and 12/26/2019. FINDINGS: Lordotic positioning on the frontal examination. The heart size and mediastinal contours are stable with aortic atherosclerosis. The lungs are clear. No pleural effusion or pneumothorax. No acute osseous findings are evident. There are mild degenerative changes in the thoracic spine. IMPRESSION: No evidence of acute cardiopulmonary process. Aortic atherosclerosis. Electronically Signed   By: Carey Bullocks M.D.   On: 03/27/2023 14:45    Scheduled Meds:  aspirin EC  81 mg Oral Daily   atenolol  25 mg Oral Daily   doxazosin  4 mg Oral QHS   finasteride  5 mg Oral Daily   potassium chloride  40 mEq Oral Q4H   rosuvastatin  5 mg Oral QHS   senna-docusate  1 tablet Oral BID   sulfamethoxazole-trimethoprim  1 tablet Oral Daily   timolol  1 drop Right Eye BID   Continuous Infusions:  sodium chloride 75 mL/hr at 03/28/23 0359     LOS: 0 days   Hughie Closs, MD [facility]  03/28/2023, 8:43 AM   *Please note that this is a verbal dictation  therefore any spelling or grammatical errors are due to the "Dragon Medical One" system interpretation.  Please page via Amion and do not message via secure chat for urgent patient care matters. Secure chat can be used for non urgent patient care matters.  How to contact the [facility] Attending or Consulting provider 7A - 7P or covering provider during after hours 7P -7A, for this patient?  Check the care team in [facility] and look for a) attending/consulting [facility] provider listed and b) the [facility] team listed. Page or secure chat 7A-7P. Log into [email or web address] and use [facility] universal password to access. If you do not have the password, please contact the hospital operator. Locate the [facility] provider you are looking for under [facility] and page to a number that you can be directly reached. If you still have difficulty reaching the provider, please page the [facility] (Director on Call) for the Hospitalists listed on amion for assistance.

## 2023-03-28 NOTE — Plan of Care (Signed)
  Problem: Education: Goal: Knowledge of General Education information will improve Description: Including pain rating scale, medication(s)/side effects and non-pharmacologic comfort measures Outcome: Progressing   Problem: Clinical Measurements: Goal: Ability to maintain clinical measurements within normal limits will improve Outcome: Progressing Goal: Diagnostic test results will improve Outcome: Progressing   Problem: Elimination: Goal: Will not experience complications related to urinary retention Outcome: Progressing   Problem: Safety: Goal: Ability to remain free from injury will improve Outcome: Progressing   

## 2023-03-28 NOTE — TOC Initial Note (Addendum)
 Transition of Care [facility]) - Initial/Assessment Note    Patient Details  Name: Adam Mathis MRN: [id number] Date of Birth: 02-17-1945  Transition of Care [facility]) CM/SW Contact:    Adrian Prows, RN Phone Number: 03/28/2023, 12:24 PM  Clinical Narrative:                 Sherron Monday w/ pt in room; pt says he lives at home w/ his dtr; he plans to return at d/c; pt says family will provide transportation; he identified POC sister Jahking Lesser ([phone]); he denies SDOH risks; pt says he does not have DME, HH services, or home oxygen; awaiting PT eval/OT evals; TOC will follow.  Expected Discharge Plan: Home/Self Care Barriers to Discharge: Continued Medical Work up   Patient Goals and CMS Choice Patient states their goals for this hospitalization and ongoing recovery are:: home CMS Medicare.gov Compare Post Acute Care list provided to:: Patient        Expected Discharge Plan and Services   Discharge Planning Services: CM Consult   Living arrangements for the past 2 months: Single Family Home                                      Prior Living Arrangements/Services Living arrangements for the past 2 months: Single Family Home Lives with:: Adult Children Patient language and need for interpreter reviewed:: Yes Do you feel safe going back to the place where you live?: Yes      Need for Family Participation in Patient Care: Yes (Comment) Care giver support system in place?: Yes (comment) Current home services:  (n/a) Criminal Activity/Legal Involvement Pertinent to Current Situation/Hospitalization: No - Comment as needed  Activities of Daily Living   ADL Screening (condition at time of admission) Independently performs ADLs?: Yes (appropriate for developmental age) Is the patient deaf or have difficulty hearing?: No Does the patient have difficulty seeing, even when wearing glasses/contacts?: No Does the patient have difficulty concentrating, remembering, or  making decisions?: No  Permission Sought/Granted Permission sought to share information with : Case Manager Permission granted to share information with : Yes, Verbal Permission Granted  Share Information with NAME: Case Manager     Permission granted to share info w Relationship: Chelsea Nusz (sister) [phone]     Emotional Assessment Appearance:: Appears stated age Attitude/Demeanor/Rapport: Gracious Affect (typically observed): Accepting Orientation: : Oriented to Self, Oriented to Place, Oriented to  Time, Oriented to Situation Alcohol / Substance Use: Not Applicable Psych Involvement: No (comment)  Admission diagnosis:  Dehydration [E86.0] AKI (acute kidney injury) (HCC) [N17.9] Encephalopathy, unspecified type [G93.40] Patient Active Problem List   Diagnosis Date Noted   AKI (acute kidney injury) (HCC) 03/27/2023   Hyponatremia 03/27/2023   Transaminitis 03/27/2023   Acute metabolic encephalopathy with vision changes 03/27/2023   Prostate cancer metastatic to bone (HCC) 03/27/2023   Essential hypertension 05/26/2010   Benign prostatic hyperplasia 05/26/2010   Obstructive sleep apnea of adult 05/26/2010   ED (erectile dysfunction) of organic origin 05/26/2010   Hyperlipidemia with target low density lipoprotein (LDL) cholesterol less than 100 mg/dL 65/78/4696   PCP:  Gwenyth Bender, MD Pharmacy:   [facility] [phone] [place], [place] - [address] [place] [address] Phone: [phone] Fax: [phone]     Social Drivers of Health (SDOH) Social History: SDOH Screenings   Food Insecurity: No Food Insecurity (03/28/2023)  Housing: Low Risk  (03/28/2023)  Transportation Needs: No Transportation Needs (03/28/2023)  Utilities: Not At Risk (03/28/2023)  Social Connections: Unknown (03/27/2023)  Tobacco Use: Low Risk  (03/27/2023)  Recent Concern: Tobacco Use - Medium Risk (12/28/2022)   Received from [facility]   SDOH  Interventions: Food Insecurity Interventions: Intervention Not Indicated, Inpatient TOC Housing Interventions: Intervention Not Indicated, Inpatient TOC Transportation Interventions: Intervention Not Indicated, Inpatient TOC Utilities Interventions: Intervention Not Indicated, Inpatient TOC   Readmission Risk Interventions     No data to display

## 2023-03-28 NOTE — Plan of Care (Signed)

## 2023-03-29 DIAGNOSIS — G9341 Metabolic encephalopathy: Secondary | ICD-10-CM | POA: Diagnosis not present

## 2023-03-29 LAB — CBC WITH DIFFERENTIAL/PLATELET
Abs Immature Granulocytes: 0.07 10*3/uL (ref 0.00–0.07)
Basophils Absolute: 0 10*3/uL (ref 0.0–0.1)
Basophils Relative: 0 %
Eosinophils Absolute: 0 10*3/uL (ref 0.0–0.5)
Eosinophils Relative: 1 %
HCT: 31.5 % — ABNORMAL LOW (ref 39.0–52.0)
Hemoglobin: 10.3 g/dL — ABNORMAL LOW (ref 13.0–17.0)
Immature Granulocytes: 1 %
Lymphocytes Relative: 14 %
Lymphs Abs: 1.2 10*3/uL (ref 0.7–4.0)
MCH: 30.6 pg (ref 26.0–34.0)
MCHC: 32.7 g/dL (ref 30.0–36.0)
MCV: 93.5 fL (ref 80.0–100.0)
Monocytes Absolute: 1.3 10*3/uL — ABNORMAL HIGH (ref 0.1–1.0)
Monocytes Relative: 16 %
Neutro Abs: 5.9 10*3/uL (ref 1.7–7.7)
Neutrophils Relative %: 68 %
Platelets: 150 10*3/uL (ref 150–400)
RBC: 3.37 MIL/uL — ABNORMAL LOW (ref 4.22–5.81)
RDW: 14.2 % (ref 11.5–15.5)
WBC: 8.6 10*3/uL (ref 4.0–10.5)
nRBC: 0 % (ref 0.0–0.2)

## 2023-03-29 LAB — COMPREHENSIVE METABOLIC PANEL
ALT: 104 U/L — ABNORMAL HIGH (ref 0–44)
AST: 91 U/L — ABNORMAL HIGH (ref 15–41)
Albumin: 2.7 g/dL — ABNORMAL LOW (ref 3.5–5.0)
Alkaline Phosphatase: 57 U/L (ref 38–126)
Anion gap: 8 (ref 5–15)
BUN: 19 mg/dL (ref 8–23)
CO2: 18 mmol/L — ABNORMAL LOW (ref 22–32)
Calcium: 8.5 mg/dL — ABNORMAL LOW (ref 8.9–10.3)
Chloride: 107 mmol/L (ref 98–111)
Creatinine, Ser: 1.08 mg/dL (ref 0.61–1.24)
GFR, Estimated: >60 mL/min (ref >60)
Glucose, Bld: 125 mg/dL — ABNORMAL HIGH (ref 70–99)
Potassium: 3.7 mmol/L (ref 3.5–5.1)
Sodium: 133 mmol/L — ABNORMAL LOW (ref 135–145)
Total Bilirubin: 0.5 mg/dL (ref 0.0–1.2)
Total Protein: 6.3 g/dL — ABNORMAL LOW (ref 6.5–8.1)

## 2023-03-29 NOTE — Evaluation (Signed)
 Occupational Therapy Evaluation Patient Details Name: Adam Mathis MRN: [id number] DOB: February 19, 1945 Today's Date: 03/29/2023   History of Present Illness   Adam Mathis is a 78 y.o. male admit with c/o of weakness, slow speech, confusion and blurred vision over past few days possibly metabolic encephalopathy.  PMH:  HTN, HLD, hx of prostate cancer, OSA not on cpap, scrotal abscess with I&D in January of 2025     Clinical Impressions PTA pt was independent with family assistance living in his 1 level home. Pt now presents with higher level cognitive deficits as well as activity tolerance and balance impairments limiting his functional independence. Pt reports he has a tub shower and OT rec S and A for shower and may benefit from a RW and shower seat once home. Pt agreeable to sponge bathing initially as he reports that is often his preference. OT will continue to follow as able in acute setting. Pt requires family support and S as well as HHOT services.   If plan is discharge home, recommend the following:   A little help with bathing/dressing/bathroom;A little help with walking and/or transfers;Assistance with cooking/housework;Direct supervision/assist for medications management;Direct supervision/assist for financial management;Assist for transportation;Help with stairs or ramp for entrance;Supervision due to cognitive status     Functional Status Assessment   Patient has had a recent decline in their functional status and demonstrates the ability to make significant improvements in function in a reasonable and predictable amount of time.     Equipment Recommendations   Tub/shower seat (RW)      Precautions/Restrictions   Precautions Precautions: Fall Recall of Precautions/Restrictions: Intact Restrictions Weight Bearing Restrictions Per Provider Order: No     Mobility Bed Mobility Overal bed mobility: Needs Assistance Bed Mobility: Supine to Sit, Sit to Supine      Supine to sit: Min assist     General bed mobility comments: min assist with scooting and used raila nd gave him a hand to pull his upper body to sitting. Very slow to get to this postioning sitting edge of bed.    Transfers Overall transfer level: Needs assistance Equipment used: Rolling walker (2 wheels) Transfers: Sit to/from Stand Sit to Stand: Contact guard assist           General transfer comment: CGA this am for SPt and amb with RW sinkiside and toilet      Balance Overall balance assessment: Mild deficits observed, not formally tested Sitting-balance support: Bilateral upper extremity supported, Feet supported Sitting balance-Leahy Scale: Good     Standing balance support: Bilateral upper extremity supported, During functional activity Standing balance-Leahy Scale: Fair                             ADL either performed or assessed with clinical judgement   ADL Overall ADL's : Needs assistance/impaired Eating/Feeding: Independent   Grooming: Wash/dry hands;Wash/dry face;Oral care;Applying deodorant;Supervision/safety;Standing;Sitting   Upper Body Bathing: Supervision/ safety   Lower Body Bathing: Contact guard assist;Sit to/from stand   Upper Body Dressing : Supervision/safety   Lower Body Dressing: Contact guard assist   Toilet Transfer: Contact guard assist   Toileting- Clothing Manipulation and Hygiene: Supervision/safety   Tub/ Shower Transfer: Contact guard assist   Functional mobility during ADLs: Contact guard assist;Rolling walker (2 wheels) General ADL Comments: mild delayed processing for higher level sequencing and problem solving, use of RW in session     Vision Baseline Vision/History: 1 Wears glasses Ability  to See in Adequate Light: 0 Adequate Patient Visual Report: No change from baseline Vision Assessment?: No apparent visual deficits     Perception Perception: Within Functional Limits       Praxis Praxis: WFL        Pertinent Vitals/Pain Pain Assessment Pain Assessment: No/denies pain     Extremity/Trunk Assessment Upper Extremity Assessment Upper Extremity Assessment: Overall WFL for tasks assessed;Right hand dominant   Lower Extremity Assessment Lower Extremity Assessment: Defer to PT evaluation   Cervical / Trunk Assessment Cervical / Trunk Assessment: Normal   Communication Communication Communication: No apparent difficulties   Cognition Arousal: Alert Behavior During Therapy: WFL for tasks assessed/performed Cognition: No family/caregiver present to determine baseline, Cognition impaired           Executive functioning impairment (select all impairments): Problem solving OT - Cognition Comments:  (higher level processing and problem solving delays)                 Following commands: Intact                  Home Living Family/patient expects to be discharged to:: Private residence Living Arrangements: Children Available Help at Discharge: Family;Available PRN/intermittently Type of Home: House Home Access: Stairs to enter [employer] of Steps: slab with no steps   Home Layout: One level     Bathroom Shower/Tub: Tub/shower unit;Door   [employer] Toilet: Standard     Home Equipment: None   Additional Comments: pt was not completely sure about home DME and set up      Prior Functioning/Environment Prior Level of Function : Independent/Modified Independent             Mobility Comments: Pt reported he walked with no assistive device, drives, adn very active, just have not felt well and in bed more than normal so he states he is feelign weak. ADLs Comments: stands for shower    OT Problem List: Decreased strength;Decreased activity tolerance;Impaired balance (sitting and/or standing);Decreased cognition   OT Treatment/Interventions: Self-care/ADL training;Therapeutic exercise;Neuromuscular education;Energy conservation;DME and/or AE  instruction;Therapeutic activities;Cognitive remediation/compensation;Patient/family education;Balance training      OT Goals(Current goals can be found in the care plan section)   Acute Rehab OT Goals Patient Stated Goal: to go home with my family OT Goal Formulation: With patient Time For Goal Achievement: 04/12/23 Potential to Achieve Goals: Good ADL Goals Pt Will Perform Lower Body Bathing: with modified independence;sit to/from stand Pt Will Perform Lower Body Dressing: sit to/from stand;with modified independence Pt Will Transfer to Toilet: with modified independence;ambulating;regular height toilet Pt Will Perform Tub/Shower Transfer: with supervision;shower seat;rolling walker   OT Frequency:  Min 1X/week       AM-PAC OT "6 Clicks" Daily Activity     Outcome Measure Help from another person eating meals?: None Help from another person taking care of personal grooming?: A Little Help from another person toileting, which includes using toliet, bedpan, or urinal?: A Little Help from another person bathing (including washing, rinsing, drying)?: A Little Help from another person to put on and taking off regular upper body clothing?: A Little Help from another person to put on and taking off regular lower body clothing?: A Little 6 Click Score: 19   End of Session Equipment Utilized During Treatment: Gait belt;Rolling walker (2 wheels) Nurse Communication: Mobility status;Weight bearing status  Activity Tolerance: Patient tolerated treatment well Patient left: in chair;with call bell/phone within reach;with chair alarm set  OT Visit Diagnosis: Unsteadiness on feet (  R26.81);Muscle weakness (generalized) (M62.81)                Time: 3875-6433 OT Time Calculation (min): 28 min Charges:  OT General Charges $OT Visit: 1 Visit OT Evaluation $OT Eval Low Complexity: 1 Low OT Treatments $Self Care/Home Management : 8-22 mins  Evangelia Whitaker OT/L Acute Rehabilitation Department   [phone]  03/29/2023, 10:31 AM

## 2023-03-29 NOTE — Progress Notes (Signed)

## 2023-03-29 NOTE — TOC Transition Note (Signed)
 Transition of Care [facility]) - Discharge Note   Patient Details  Name: Adam Mathis MRN: [id number] Date of Birth: 12/14/1945  [facility]) CM/SW Contact:  Otelia Santee, LCSW Phone Number: 03/29/2023, 10:09 AM   Clinical Narrative:    Met with pt and confirmed plan to return home with home health services. Pt denies having HH services in the past and does not have preference for HHA. HHPT/OT has been arranged with [facility]. HH orders are in place.  Pt also recommended for RW. Pt agreeable to having one ordered. RW has been ordered through [employer] and will be delivered to pt's room prior to discharge. Pt reports that his sister will provide transportation for him at discharge.    Final next level of care: Home w Home Health Services Barriers to Discharge: Barriers Resolved   Patient Goals and CMS Choice Patient states their goals for this hospitalization and ongoing recovery are:: home CMS Medicare.gov Compare Post Acute Care list provided to:: Patient   [place] ownership interest in [facility].provided to::  (NA)    Discharge Placement                       Discharge Plan and Services Additional resources added to the After Visit Summary for     Discharge Planning Services: CM Consult            DME Arranged: Dan Humphreys rolling DME Agency: [facility] Date DME Agency Contacted: 03/29/23 Time DME Agency Contacted: 1009 Representative spoke with at DME Agency: Zack HH Arranged: PT, OT HH Agency: [facility] Date [facility] Agency Contacted: 03/29/23 Time HH Agency Contacted: 1009 Representative spoke with at [facility] Agency: Cindie  Social Drivers of Health (SDOH) Interventions SDOH Screenings   Food Insecurity: No Food Insecurity (03/28/2023)  Housing: Low Risk  (03/28/2023)  Transportation Needs: No Transportation Needs (03/28/2023)  Utilities: Not At Risk (03/28/2023)  Social Connections: Unknown (03/27/2023)  Tobacco Use: Low Risk  (03/27/2023)   Recent Concern: Tobacco Use - Medium Risk (12/28/2022)   Received from [facility]     Readmission Risk Interventions     No data to display

## 2023-03-29 NOTE — Discharge Summary (Signed)
 Physician Discharge Summary  Adam Mathis:[id number] DOB: 07-27-1945 DOA: 03/27/2023  PCP: Gwenyth Bender, MD  Admit date: 03/27/2023 Discharge date: 03/29/2023    Admitted From: Home Disposition: Home  Recommendations for Outpatient Follow-up:  Follow up with PCP in 1-2 weeks Please obtain BMP/CBC in one week Please follow up with your PCP on the following pending results: Unresulted Labs (From admission, onward)    None         Home Health: Yes Equipment/Devices: Rolling walker  Discharge Condition: Stable CODE STATUS: Full code Diet recommendation: Cardiac  Subjective: Seen and examined.  No complaints.  Fully alert and oriented.  Does not have any further confusion or any visual changes.  Discussed plan of discharge and he is in agreement.  Brief/Interim Summary: Adam Mathis is a 78 y.o. male with medical history significant of HTN, HLD, hx of prostate cancer, OSA not on cpap, scrotal abscess with I&D in January of 2025 who presented to Ed with complaints of four day history of weakness and falls and poor PO intake.  Per his niece, he has been laying around and not really getting out of the bed. His niece went to see him the day of admission and he was very slow speaking and maybe some confusion. He also fell the night before and his daughter had to get him into bed. He has had blurry vision too which is new this also started the night before.  No other complaint. At baseline he is completely independent with all of his ADLs. He cooks, drives, does his finances.  Upon arrival to ED, he was hemodynamically stable.  Was slightly confused.  Also found to have AKI.  He was admitted to hospital service for the following.  Acute metabolic encephalopathy with vision changes, POA/fever, not POA: -no concerning signs of infectious process upon admission however he has now developed fever of 101 at 4 AM on 03/28/2023.  UA unremarkable, urine culture pending. Hx of scrotal abscess s/p  I&D in January. CT head followed by MRI brain negative for acute pathology.  No antibiotics initiated, he was observed overnight, he has remained afebrile.  I wonder if that temperature spike was erroneous.  He has been fully alert and oriented since yesterday with no further complaints of visual changes or blurry vision.  I think his acute metabolic encephalopathy was likely secondary to dehydration/AKI.   AKI (acute kidney injury) (HCC)/dehydration Likely pre renal in setting of poor PO Intake, resolved with IV fluids.   Hyponatremia Presented with 131, likely hypovolemia hyponatremia, received IV fluids, improving to 133 today.   Hypokalemia: Resolved   Transaminitis/elevated LFTs: Viral hepatitis panel negative.  AST slightly elevated than ALT and both numbers are only slightly elevated.  He has been ruled out of viral hepatitis panel.  Nothing much to do as inpatient.  Recommend repeating labs in 1 week.   Essential hypertension Blood pressure to goal, appears on no medication    Benign prostatic hyperplasia Resume home medications.   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  orchiectomy 2023, androgen deprivation. No other therapy.  Most Recent Restaging - 06/2021 CT, BS - stable T11 mets    Generalized weakness: As per PT OT and they recommended home health with rolling walker.  Discharge Diagnoses:  Principal Problem:   Acute metabolic encephalopathy with vision changes Active Problems:   AKI (acute kidney injury) (HCC)   Hyponatremia   Transaminitis   Essential hypertension   Hyperlipidemia with  target low density lipoprotein (LDL) cholesterol less than 100 mg/dL   Benign prostatic hyperplasia   Prostate cancer metastatic to bone [facility])    Discharge Instructions   Allergies as of 03/29/2023       Reactions   Penicillins Hives, Rash   Sulfa Antibiotics Other (See Comments)   Stomach distress        Medication List     STOP taking these  medications    doxazosin 4 MG tablet Commonly known as: CARDURA   finasteride 5 MG tablet Commonly known as: PROSCAR   HYDROcodone-acetaminophen 5-325 MG tablet Commonly known as: NORCO/VICODIN   senna-docusate 8.6-50 MG tablet Commonly known as: Senokot-S       TAKE these medications    aspirin EC 81 MG tablet Take 81 mg by mouth daily. Swallow whole.   atenolol 25 MG tablet Commonly known as: TENORMIN Take 25 mg by mouth daily.   loratadine 10 MG tablet Commonly known as: CLARITIN Take 10 mg by mouth daily.   meclizine 12.5 MG tablet Commonly known as: ANTIVERT Take 1 tablet (12.5 mg total) by mouth 3 (three) times daily as needed for dizziness. What changed: how much to take   rosuvastatin 5 MG tablet Commonly known as: CRESTOR Take 5 mg by mouth at bedtime.   sulfamethoxazole-trimethoprim 800-160 MG tablet Commonly known as: BACTRIM DS Take 1 tablet by mouth daily.   timolol 0.5 % ophthalmic solution Commonly known as: TIMOPTIC Place 1 drop into the right eye 2 (two) times daily.               Durable Medical Equipment  (From admission, onward)           Start     Ordered   03/29/23 1002  For home use only DME Walker rolling  Once       Question Answer Comment  Walker: With 5 Inch Wheels   Patient needs a walker to treat with the following condition Balance problem      03/29/23 1001            Follow-up Information     Gwenyth Bender, MD Follow up in 1 week(s).   Specialty: Internal Medicine Contact information: [address] [place] [address] [phone]                Allergies  Allergen Reactions   Penicillins Hives and Rash   Sulfa Antibiotics Other (See Comments)    Stomach distress    Consultations: None   Procedures/Studies: MR BRAIN WO CONTRAST Result Date: 03/27/2023 CLINICAL DATA:  Initial evaluation for acute neuro deficit, stroke suspected EXAM: MRI HEAD WITHOUT CONTRAST  TECHNIQUE: Multiplanar, multiecho pulse sequences of the brain and surrounding structures were obtained without intravenous contrast. COMPARISON:  CT from earlier the same day. FINDINGS: Brain: Cerebral volume within normal limits. Patchy T2/FLAIR hyperintensity involving the periventricular deep white matter both cerebral hemispheres as well as the pons, consistent with chronic small vessel ischemic disease, mild to moderate in nature. Few scattered remote lacunar infarcts present about the left greater than right basal ganglia/corona radiata. No abnormal foci of restricted diffusion to suggest acute or subacute ischemia. Gray-white matter differentiation maintained. No acute intracranial hemorrhage. Small focus of chronic hemosiderin staining noted at the right parieto-occipital region. No other significant chronic intracranial blood products. No mass lesion, midline shift or mass effect. No hydrocephalus or extra-axial fluid collection. Pituitary gland within normal limits. Vascular: Major intracranial vascular flow voids are maintained.  Skull and upper cervical spine: Craniocervical junction within normal limits. Bone marrow signal intensity normal. No scalp soft tissue abnormality. Sinuses/Orbits: Prior bilateral ocular lens replacement. Paranasal sinuses are clear. Trace left mastoid effusion, of doubtful significance. Other: None. IMPRESSION: 1. No acute intracranial abnormality. 2. Mild to moderate chronic microvascular ischemic disease with a few scattered remote lacunar infarcts about the left greater than right basal ganglia/corona radiata. Electronically Signed   By: Rise Mu M.D.   On: 03/27/2023 23:02   CT Head Wo Contrast Result Date: 03/27/2023 CLINICAL DATA:  Mental status change, unknown cause Status post fall with weakness. EXAM: CT HEAD WITHOUT CONTRAST TECHNIQUE: Contiguous axial images were obtained from the base of the skull through the vertex without intravenous contrast.  RADIATION DOSE REDUCTION: This exam was performed according to the departmental dose-optimization program which includes automated exposure control, adjustment of the mA and/or kV according to patient size and/or use of iterative reconstruction technique. COMPARISON:  Brain MRI 08/30/2022 FINDINGS: Brain: No intracranial hemorrhage, mass effect, or midline shift. Stable atrophy. No hydrocephalus. The basilar cisterns are patent. Periventricular and deep white matter hypodensity consistent with chronic small vessel ischemia. 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 No evidence of territorial infarct or acute ischemia. No extra-axial or intracranial fluid collection. Vascular: Atherosclerosis of skullbase vasculature without hyperdense vessel or abnormal calcification. Skull: No fracture or focal lesion. Sinuses/Orbits: No acute finding.  Bilateral cataract resection. Other: None. IMPRESSION: 1.  No acute intracranial abnormality. 2. Stable atrophy and chronic ischemia. Electronically Signed   By: Narda Rutherford M.D.   On: 03/27/2023 17:11   DG Chest 2 View Result Date: 03/27/2023 CLINICAL DATA:  Shortness of breath. Status post fall with weakness, blurry vision and abdominal pain. EXAM: CHEST - 2 VIEW COMPARISON:  Radiographs 11/11/2020 and 12/26/2019. FINDINGS: Lordotic positioning on the frontal examination. The heart size and mediastinal contours are stable with aortic atherosclerosis. The lungs are clear. No pleural effusion or pneumothorax. No acute osseous findings are evident. There are mild degenerative changes in the thoracic spine. IMPRESSION: No evidence of acute cardiopulmonary process. Aortic atherosclerosis. Electronically Signed   By: Carey Bullocks M.D.   On: 03/27/2023 14:45     Discharge Exam: Vitals:   03/28/23 2041 03/29/23 0453  BP: 124/70 (!) 142/73  Pulse: 74 64  Resp: 20 20  Temp: 98.1 F (36.7 C) 98.1 F (36.7 C)  SpO2: 99% 98%   Vitals:    03/28/23 0454 03/28/23 1135 03/28/23 2041 03/29/23 0453  BP: 130/61 (!) 150/66 124/70 (!) 142/73  Pulse: 76 78 74 64  Resp: 18 20 20 20   Temp: (!) 101 F (38.3 C) 97.9 F (36.6 C) 98.1 F (36.7 C) 98.1 F (36.7 C)  TempSrc: Oral  Oral Oral  SpO2: 99% 100% 99% 98%  Weight:      Height:        General: Pt is alert, awake, not in acute distress Cardiovascular: RRR, S1/S2 +, no rubs, no gallops Respiratory: CTA bilaterally, no wheezing, no rhonchi Abdominal: Soft, NT, ND, bowel sounds + Extremities: no edema, no cyanosis    The results of significant diagnostics from this hospitalization (including imaging, microbiology, ancillary and laboratory) are listed below for reference.     Microbiology: Recent Results (from the past 240 hours)  Resp panel by RT-PCR (RSV, Flu A&B, Covid) Anterior Nasal Swab     Status: None   Collection Time: 03/27/23  2:02 PM   Specimen: Anterior Nasal Swab  Result Value Ref Range Status   SARS Coronavirus 2 by RT PCR NEGATIVE NEGATIVE Final    Comment: (NOTE) SARS-CoV-2 target nucleic acids are NOT DETECTED.  The SARS-CoV-2 RNA is generally detectable in upper respiratory specimens during the acute phase of infection. The lowest concentration of SARS-CoV-2 viral copies this assay can detect is 138 copies/mL. A negative result does not preclude SARS-Cov-2 infection and should not be used as the sole basis for treatment or other patient management decisions. A negative result may occur with  improper specimen collection/handling, submission of specimen other than nasopharyngeal swab, presence of viral mutation(s) within the areas targeted by this assay, and inadequate number of viral copies(<138 copies/mL). A negative result must be combined with clinical observations, patient history, and epidemiological information. The expected result is Negative.  Fact Sheet for Patients:  BloggerCourse.com  Fact Sheet for Healthcare  Providers:  SeriousBroker.it  This test is no t yet approved or cleared by the [place] FDA and  has been authorized for detection and/or diagnosis of SARS-CoV-2 by FDA under an Emergency Use Authorization (EUA). This EUA will remain  in effect (meaning this test can be used) for the duration of the COVID-19 declaration under Section 564(b)(1) of the Act, 21 U.S.C.section 360bbb-3(b)(1), unless the authorization is terminated  or revoked sooner.       Influenza A by PCR NEGATIVE NEGATIVE Final   Influenza B by PCR NEGATIVE NEGATIVE Final    Comment: (NOTE) The Xpert Xpress SARS-CoV-2/FLU/RSV plus assay is intended as an aid in the diagnosis of influenza from Nasopharyngeal swab specimens and should not be used as a sole basis for treatment. Nasal washings and aspirates are unacceptable for Xpert Xpress SARS-CoV-2/FLU/RSV testing.  Fact Sheet for Patients: BloggerCourse.com  Fact Sheet for Healthcare Providers: SeriousBroker.it  This test is not yet approved or cleared by the [place] FDA and has been authorized for detection and/or diagnosis of SARS-CoV-2 by FDA under an Emergency Use Authorization (EUA). This EUA will remain in effect (meaning this test can be used) for the duration of the COVID-19 declaration under Section 564(b)(1) of the Act, 21 U.S.C. section 360bbb-3(b)(1), unless the authorization is terminated or revoked.     Resp Syncytial Virus by PCR NEGATIVE NEGATIVE Final    Comment: (NOTE) Fact Sheet for Patients: BloggerCourse.com  Fact Sheet for Healthcare Providers: SeriousBroker.it  This test is not yet approved or cleared by the [place] FDA and has been authorized for detection and/or diagnosis of SARS-CoV-2 by FDA under an Emergency Use Authorization (EUA). This EUA will remain in effect (meaning this test can be  used) for the duration of the COVID-19 declaration under Section 564(b)(1) of the Act, 21 U.S.C. section 360bbb-3(b)(1), unless the authorization is terminated or revoked.  Performed at [facility], [address], [place], [place] [address]   Urine Culture (for pregnant, neutropenic or urologic patients or patients with an indwelling urinary catheter)     Status: None (Preliminary result)   Collection Time: 03/27/23  4:13 PM   Specimen: Urine, Clean Catch  Result Value Ref Range Status   Specimen Description   Final    URINE, CLEAN CATCH Performed at [facility], [address], [place], [place] [address]    Special Requests   Final    NONE Performed at [facility], [address], [place], [place] [address]    Culture   Final    CULTURE REINCUBATED FOR BETTER GROWTH Performed at  [facility], [address] [place], [place] [address]    Report Status PENDING  Incomplete     Labs: BNP (last 3 results) No results for input(s): "BNP" in the last 8760 hours. Basic Metabolic Panel: Recent Labs  Lab 03/27/23 1315 03/27/23 2019 03/28/23 0613 03/29/23 0553  NA 131*  --  132* 133*  K 3.4*  --  3.1* 3.7  CL 98  --  104 107  CO2 19*  --  18* 18*  GLUCOSE 150*  --  130* 125*  BUN 28*  --  21 19  CREATININE 1.78*  --  1.24 1.08  CALCIUM 9.1  --  8.3* 8.5*  MG  --  2.1  --   --    Liver Function Tests: Recent Labs  Lab 03/27/23 1402 03/28/23 0613 03/29/23 0553  AST 90* 76* 91*  ALT 66* 71* 104*  ALKPHOS 62 54 57  BILITOT 1.0 0.6 0.5  PROT 8.0 6.4* 6.3*  ALBUMIN 3.6 2.8* 2.7*   Recent Labs  Lab 03/27/23 1315  LIPASE 51   Recent Labs  Lab 03/27/23 1731  AMMONIA 11   CBC: Recent Labs  Lab 03/27/23 1315 03/28/23 0613 03/29/23 0553  WBC 10.9* 8.1 8.6  NEUTROABS  --   --  5.9  HGB 12.0* 10.2* 10.3*  HCT 35.5* 31.3* 31.5*  MCV 89.9 92.6 93.5  PLT 162 146* 150   Cardiac Enzymes: No  results for input(s): "CKTOTAL", "CKMB", "CKMBINDEX", "TROPONINI" in the last 168 hours. BNP: Invalid input(s): "POCBNP" CBG: Recent Labs  Lab 03/27/23 1257  GLUCAP 134*   D-Dimer No results for input(s): "DDIMER" in the last 72 hours. Hgb A1c No results for input(s): "HGBA1C" in the last 72 hours. Lipid Profile No results for input(s): "CHOL", "HDL", "LDLCALC", "TRIG", "CHOLHDL", "LDLDIRECT" in the last 72 hours. Thyroid function studies Recent Labs    03/27/23 1800  TSH 1.478   Anemia work up Recent Labs    03/27/23 1800  VITAMINB12 432   Urinalysis    Component Value Date/Time   COLORURINE YELLOW 03/27/2023 1613   APPEARANCEUR HAZY (A) 03/27/2023 1613   LABSPEC 1.018 03/27/2023 1613   PHURINE 5.0 03/27/2023 1613   GLUCOSEU NEGATIVE 03/27/2023 1613   HGBUR MODERATE (A) 03/27/2023 1613   BILIRUBINUR NEGATIVE 03/27/2023 1613   KETONESUR 5 (A) 03/27/2023 1613   PROTEINUR 100 (A) 03/27/2023 1613   NITRITE NEGATIVE 03/27/2023 1613   LEUKOCYTESUR NEGATIVE 03/27/2023 1613   Sepsis Labs Recent Labs  Lab 03/27/23 1315 03/28/23 0613 03/29/23 0553  WBC 10.9* 8.1 8.6   Microbiology Recent Results (from the past 240 hours)  Resp panel by RT-PCR (RSV, Flu A&B, Covid) Anterior Nasal Swab     Status: None   Collection Time: 03/27/23  2:02 PM   Specimen: Anterior Nasal Swab  Result Value Ref Range Status   SARS Coronavirus 2 by RT PCR NEGATIVE NEGATIVE Final    Comment: (NOTE) SARS-CoV-2 target nucleic acids are NOT DETECTED.  The SARS-CoV-2 RNA is generally detectable in upper respiratory specimens during the acute phase of infection. The lowest concentration of SARS-CoV-2 viral copies this assay can detect is 138 copies/mL. A negative result does not preclude SARS-Cov-2 infection and should not be used as the sole basis for treatment or other patient management decisions. A negative result may occur with  improper specimen collection/handling, submission of  specimen other than nasopharyngeal swab, presence of viral mutation(s) within the areas targeted by this assay, and inadequate  number of viral copies(<138 copies/mL). A negative result must be combined with clinical observations, patient history, and epidemiological information. The expected result is Negative.  Fact Sheet for Patients:  BloggerCourse.com  Fact Sheet for Healthcare Providers:  SeriousBroker.it  This test is no t yet approved or cleared by the [place] FDA and  has been authorized for detection and/or diagnosis of SARS-CoV-2 by FDA under an Emergency Use Authorization (EUA). This EUA will remain  in effect (meaning this test can be used) for the duration of the COVID-19 declaration under Section 564(b)(1) of the Act, 21 U.S.C.section 360bbb-3(b)(1), unless the authorization is terminated  or revoked sooner.       Influenza A by PCR NEGATIVE NEGATIVE Final   Influenza B by PCR NEGATIVE NEGATIVE Final    Comment: (NOTE) The Xpert Xpress SARS-CoV-2/FLU/RSV plus assay is intended as an aid in the diagnosis of influenza from Nasopharyngeal swab specimens and should not be used as a sole basis for treatment. Nasal washings and aspirates are unacceptable for Xpert Xpress SARS-CoV-2/FLU/RSV testing.  Fact Sheet for Patients: BloggerCourse.com  Fact Sheet for Healthcare Providers: SeriousBroker.it  This test is not yet approved or cleared by the [place] FDA and has been authorized for detection and/or diagnosis of SARS-CoV-2 by FDA under an Emergency Use Authorization (EUA). This EUA will remain in effect (meaning this test can be used) for the duration of the COVID-19 declaration under Section 564(b)(1) of the Act, 21 U.S.C. section 360bbb-3(b)(1), unless the authorization is terminated or revoked.     Resp Syncytial Virus by PCR NEGATIVE NEGATIVE Final     Comment: (NOTE) Fact Sheet for Patients: BloggerCourse.com  Fact Sheet for Healthcare Providers: SeriousBroker.it  This test is not yet approved or cleared by the [place] FDA and has been authorized for detection and/or diagnosis of SARS-CoV-2 by FDA under an Emergency Use Authorization (EUA). This EUA will remain in effect (meaning this test can be used) for the duration of the COVID-19 declaration under Section 564(b)(1) of the Act, 21 U.S.C. section 360bbb-3(b)(1), unless the authorization is terminated or revoked.  Performed at [facility], [address] [place], [place] [address]   Urine Culture (for pregnant, neutropenic or urologic patients or patients with an indwelling urinary catheter)     Status: None (Preliminary result)   Collection Time: 03/27/23  4:13 PM   Specimen: Urine, Clean Catch  Result Value Ref Range Status   Specimen Description   Final    URINE, CLEAN CATCH Performed at [facility], [address], [place], [place] [address]    Special Requests   Final    NONE Performed at [facility], [address], [place], [place] [address]    Culture   Final    CULTURE REINCUBATED FOR BETTER GROWTH Performed at [facility], [address] [place], [place] [address]    Report Status PENDING  Incomplete    FURTHER DISCHARGE INSTRUCTIONS:   Get Medicines reviewed and adjusted: Please take all your medications with you for your next visit with your Primary MD   Laboratory/radiological data: Please request your Primary MD to go over all hospital tests and procedure/radiological results at the follow up, please ask your Primary MD to get all Hospital records sent to his/her office.   In some cases, they will be blood work, cultures and biopsy results pending at the time of your discharge. Please request that your primary care M.D. goes  through  all the records of your hospital data and follows up on these results.   Also Note the following: If you experience worsening of your admission symptoms, develop shortness of breath, life threatening emergency, suicidal or homicidal thoughts you must seek medical attention immediately by calling 911 or calling your MD immediately  if symptoms less severe.   You must read complete instructions/literature along with all the possible adverse reactions/side effects for all the Medicines you take and that have been prescribed to you. Take any new Medicines after you have completely understood and accpet all the possible adverse reactions/side effects.    Do not drive when taking Pain medications or sleeping medications (Benzodaizepines)   Do not take more than prescribed Pain, Sleep and Anxiety Medications. It is not advisable to combine anxiety,sleep and pain medications without talking with your primary care practitioner   Special Instructions: If you have smoked or chewed Tobacco  in the last 2 yrs please stop smoking, stop any regular Alcohol  and or any Recreational drug use.   Wear Seat belts while driving.   Please note: You were cared for by a hospitalist during your hospital stay. Once you are discharged, your primary care physician will handle any further medical issues. Please note that NO REFILLS for any discharge medications will be authorized once you are discharged, as it is imperative that you return to your primary care physician (or establish a relationship with a primary care physician if you do not have one) for your post hospital discharge needs so that they can reassess your need for medications and monitor your lab values  Time coordinating discharge: Over 30 minutes  SIGNED:   Hughie Closs, MD  [facility] 03/29/2023, 10:01 AM *Please note that this is a verbal dictation therefore any spelling or grammatical errors are due to the "Dragon [employer] One" system  interpretation. If 7PM-7AM, please contact night-coverage [email or web address]

## 2023-03-30 LAB — URINE CULTURE: Culture: >=100000 — AB

## 2023-03-30 NOTE — Plan of Care (Signed)
 I just received your urine culture report which shows Enterococcus faecalis. UA is not impressive to me. he Did not have any urinary symptoms. he Did develop fever only 1 time 101 at that he remained afebrile for 24 hours after that.  But male patient growing bacteria in the urine could not be ignored either.  Discussed with patient's PCP or secure chat and he is going to send antibiotics to his pharmacy.

## 2023-04-19 DIAGNOSIS — G4733 Obstructive sleep apnea (adult) (pediatric): Secondary | ICD-10-CM | POA: Diagnosis not present

## 2023-04-19 DIAGNOSIS — N182 Chronic kidney disease, stage 2 (mild): Secondary | ICD-10-CM | POA: Diagnosis not present

## 2023-04-19 DIAGNOSIS — H8113 Benign paroxysmal vertigo, bilateral: Secondary | ICD-10-CM | POA: Diagnosis not present

## 2023-04-19 DIAGNOSIS — R7303 Prediabetes: Secondary | ICD-10-CM | POA: Diagnosis not present

## 2023-04-19 DIAGNOSIS — E782 Mixed hyperlipidemia: Secondary | ICD-10-CM | POA: Diagnosis not present

## 2023-04-19 DIAGNOSIS — N390 Urinary tract infection, site not specified: Secondary | ICD-10-CM | POA: Diagnosis not present

## 2023-04-19 DIAGNOSIS — H40063 Primary angle closure without glaucoma damage, bilateral: Secondary | ICD-10-CM | POA: Diagnosis not present

## 2023-04-19 DIAGNOSIS — I119 Hypertensive heart disease without heart failure: Secondary | ICD-10-CM | POA: Diagnosis not present

## 2023-04-30 ENCOUNTER — Emergency Department (HOSPITAL_COMMUNITY)
Admission: EM | Admit: 2023-04-30 | Discharge: 2023-04-30 | Disposition: A | Discharge status: Home / Self Care | Attending: Emergency Medicine | Admitting: Emergency Medicine

## 2023-04-30 ENCOUNTER — Encounter (HOSPITAL_COMMUNITY): Payer: Self-pay

## 2023-04-30 ENCOUNTER — Emergency Department (HOSPITAL_COMMUNITY)

## 2023-04-30 DIAGNOSIS — R109 Unspecified abdominal pain: Secondary | ICD-10-CM | POA: Diagnosis not present

## 2023-04-30 DIAGNOSIS — R1030 Lower abdominal pain, unspecified: Secondary | ICD-10-CM | POA: Diagnosis present

## 2023-04-30 DIAGNOSIS — N390 Urinary tract infection, site not specified: Secondary | ICD-10-CM | POA: Insufficient documentation

## 2023-04-30 DIAGNOSIS — Z7982 Long term (current) use of aspirin: Secondary | ICD-10-CM | POA: Diagnosis not present

## 2023-04-30 DIAGNOSIS — N281 Cyst of kidney, acquired: Secondary | ICD-10-CM | POA: Diagnosis not present

## 2023-04-30 LAB — CBC
HCT: 32.9 % — ABNORMAL LOW (ref 39.0–52.0)
Hemoglobin: 10.7 g/dL — ABNORMAL LOW (ref 13.0–17.0)
MCH: 29.9 pg (ref 26.0–34.0)
MCHC: 32.5 g/dL (ref 30.0–36.0)
MCV: 91.9 fL (ref 80.0–100.0)
Platelets: 205 10*3/uL (ref 150–400)
RBC: 3.58 MIL/uL — ABNORMAL LOW (ref 4.22–5.81)
RDW: 14.4 % (ref 11.5–15.5)
WBC: 6.3 10*3/uL (ref 4.0–10.5)
nRBC: 0 % (ref 0.0–0.2)

## 2023-04-30 LAB — COMPREHENSIVE METABOLIC PANEL WITH GFR
ALT: 22 U/L (ref 0–44)
AST: 21 U/L (ref 15–41)
Albumin: 4.1 g/dL (ref 3.5–5.0)
Alkaline Phosphatase: 69 U/L (ref 38–126)
Anion gap: 10 (ref 5–15)
BUN: 13 mg/dL (ref 8–23)
CO2: 21 mmol/L — ABNORMAL LOW (ref 22–32)
Calcium: 9.7 mg/dL (ref 8.9–10.3)
Chloride: 104 mmol/L (ref 98–111)
Creatinine, Ser: 1.23 mg/dL (ref 0.61–1.24)
GFR, Estimated: >60 mL/min (ref >60)
Glucose, Bld: 132 mg/dL — ABNORMAL HIGH (ref 70–99)
Potassium: 3.6 mmol/L (ref 3.5–5.1)
Sodium: 135 mmol/L (ref 135–145)
Total Bilirubin: 0.4 mg/dL (ref 0.0–1.2)
Total Protein: 7.6 g/dL (ref 6.5–8.1)

## 2023-04-30 LAB — URINALYSIS, ROUTINE W REFLEX MICROSCOPIC
Bacteria, UA: NONE SEEN
Bilirubin Urine: NEGATIVE
Glucose, UA: NEGATIVE mg/dL
Ketones, ur: NEGATIVE mg/dL
Nitrite: NEGATIVE
Protein, ur: 30 mg/dL — AB
RBC / HPF: >50 RBC/hpf (ref 0–5)
Specific Gravity, Urine: 1.012 (ref 1.005–1.030)
WBC, UA: >50 WBC/hpf (ref 0–5)
pH: 5 (ref 5.0–8.0)

## 2023-04-30 LAB — LIPASE, BLOOD: Lipase: 33 U/L (ref 11–51)

## 2023-04-30 MED ORDER — CEFDINIR 300 MG PO CAPS: 300.0000 mg | ORAL_CAPSULE | Freq: Two times a day (BID) | ORAL | 0 refills | Status: AC

## 2023-04-30 MED ORDER — SODIUM CHLORIDE 0.9 % IV BOLUS
500.0000 mL | Freq: Once | INTRAVENOUS | Status: AC
  Administered 2023-04-30: 500 mL via INTRAVENOUS

## 2023-04-30 MED ORDER — IOHEXOL 300 MG/ML  SOLN
100.0000 mL | Freq: Once | INTRAMUSCULAR | Status: AC | PRN
  Administered 2023-04-30: 100 mL via INTRAVENOUS

## 2023-04-30 MED ORDER — SODIUM CHLORIDE 0.9 % IV SOLN
1.0000 g | Freq: Once | INTRAVENOUS | Status: AC
  Administered 2023-04-30: 1 g via INTRAVENOUS
  Filled 2023-04-30: qty 10

## 2023-04-30 NOTE — ED Triage Notes (Signed)
 Pt states that he can't stop urinating and he has been having abd pain since yesterday. Pt states that he thinks he has a staph infection like last time?

## 2023-04-30 NOTE — ED Provider Notes (Signed)
 [place] EMERGENCY DEPARTMENT AT [facility] Provider Note   CSN: [id number] Arrival date & time: 04/30/23  0104     History  Chief Complaint  Patient presents with   Abdominal Pain    Adam Mathis is a 78 y.o. male.  The history is provided by the patient and medical records.  Abdominal Pain Adam Mathis is a 78 y.o. male who presents to the Emergency Department complaining of lower abdominal pain.  He presents to the emergency department for evaluation of lower abdominal pain that started this morning.  He describes it as a discomfort.  He has associated urinary frequency and feels like he is urinating a lot.  No dysuria.  No fever, nausea, vomiting, diarrhea.  He did have a similar episode in the past related to a staph infection.     Home Medications Prior to Admission medications   Medication Sig Start Date End Date Taking? Authorizing Provider  aspirin EC 81 MG tablet Take 81 mg by mouth daily. Swallow whole.   Yes [provider]  atenolol (TENORMIN) 25 MG tablet Take 25 mg by mouth daily.   Yes [provider]  cefdinir (OMNICEF) 300 MG capsule Take 1 capsule (300 mg total) by mouth 2 (two) times daily. 04/30/23  Yes Tilden Fossa, MD  meclizine (ANTIVERT) 12.5 MG tablet Take 1 tablet (12.5 mg total) by mouth 3 (three) times daily as needed for dizziness. Patient taking differently: Take 12.5-25 mg by mouth 3 (three) times daily as needed for dizziness. 05/20/16  Yes Long, Arlyss Repress, MD  rosuvastatin (CRESTOR) 5 MG tablet Take 5 mg by mouth at bedtime.   Yes [provider]  timolol (TIMOPTIC) 0.5 % ophthalmic solution Place 1 drop into the right eye 2 (two) times daily. 10/01/17  Yes [provider]  loratadine (CLARITIN) 10 MG tablet Take 10 mg by mouth daily. Patient not taking: Reported on 04/30/2023 11/18/17   [provider]  nitrofurantoin, macrocrystal-monohydrate, (MACROBID) 100 MG capsule Take 100 mg by  mouth 2 (two) times daily. Patient not taking: Reported on 04/30/2023 03/30/23   [provider]  sulfamethoxazole-trimethoprim (BACTRIM DS) 800-160 MG tablet Take 1 tablet by mouth daily. Patient not taking: Reported on 04/30/2023 02/19/23   Loletta Parish., MD      Allergies    Penicillins and Sulfa antibiotics    Review of Systems   Review of Systems  Gastrointestinal:  Positive for abdominal pain.  All other systems reviewed and are negative.   Physical Exam Updated Vital Signs BP (!) 155/61   Pulse 66   Temp 98 F (36.7 C)   Resp 16   SpO2 100%  Physical Exam Vitals and nursing note reviewed.  Constitutional:      Appearance: He is well-developed.  HENT:     Head: Normocephalic and atraumatic.  Cardiovascular:     Rate and Rhythm: Normal rate and regular rhythm.  Pulmonary:     Effort: Pulmonary effort is normal. No respiratory distress.  Abdominal:     Palpations: Abdomen is soft.     Tenderness: There is no abdominal tenderness. There is no guarding or rebound.  Genitourinary:    Penis: Normal.      Comments: Scrotal sac is empty, no surrounding erythema or edema Musculoskeletal:        General: No tenderness.  Skin:    General: Skin is warm and dry.  Neurological:     Mental Status: He is alert  and oriented to person, place, and time.  Psychiatric:        Behavior: Behavior normal.     ED Results / Procedures / Treatments   Labs (all labs ordered are listed, but only abnormal results are displayed) Labs Reviewed  COMPREHENSIVE METABOLIC PANEL WITH GFR - Abnormal; Notable for the following components:      Result Value   CO2 21 (*)    Glucose, Bld 132 (*)    All other components within normal limits  CBC - Abnormal; Notable for the following components:   RBC 3.58 (*)    Hemoglobin 10.7 (*)    HCT 32.9 (*)    All other components within normal limits  URINALYSIS, ROUTINE W REFLEX MICROSCOPIC - Abnormal; Notable for the following  components:   APPearance CLOUDY (*)    Hgb urine dipstick LARGE (*)    Protein, ur 30 (*)    Leukocytes,Ua LARGE (*)    All other components within normal limits  URINE CULTURE  LIPASE, BLOOD    EKG None  Radiology CT ABDOMEN PELVIS W CONTRAST Result Date: 04/30/2023 CLINICAL DATA:  Abdominal pain since yesterday.  Urinary frequency. EXAM: CT ABDOMEN AND PELVIS WITH CONTRAST TECHNIQUE: Multidetector CT imaging of the abdomen and pelvis was performed using the standard protocol following bolus administration of intravenous contrast. RADIATION DOSE REDUCTION: This exam was performed according to the departmental dose-optimization program which includes automated exposure control, adjustment of the mA and/or kV according to patient size and/or use of iterative reconstruction technique. CONTRAST:  OMNIPAQUE IOHEXOL 300 MG/ML  SOLN COMPARISON:  07/21/2021 FINDINGS: Lower chest: 5 mm left lower lobe nodule on 45/5 is stable since 07/21/2021 consistent with benign etiology. No followup imaging is recommended. Hepatobiliary: Numerous scattered tiny hepatic cysts are again noted. No suspicious focal abnormality within the liver parenchyma. There is no evidence for gallstones, gallbladder wall thickening, or pericholecystic fluid. No intrahepatic or extrahepatic biliary dilation. Pancreas: No focal mass lesion. No dilatation of the main duct. No intraparenchymal cyst. No peripancreatic edema. Spleen: No splenomegaly. No suspicious focal mass lesion. Adrenals/Urinary Tract: No adrenal nodule or mass. 2.8 cm simple interpolar right renal cyst mildly progressive in the interval. No followup imaging is recommended. Cortical scarring noted left kidney with similar 15 mm anterior interpolar simple cyst. No followup imaging is recommended. Right ureter unremarkable. There is some fullness in the left intrarenal collecting system and ureter with possible mild urothelial enhancement in the mid left ureter. The  urinary bladder appears normal for the degree of distention. Stomach/Bowel: Stomach is nondistended. Duodenum is normally positioned as is the ligament of Treitz. No small bowel wall thickening. No small bowel dilatation. The terminal ileum is normal. The appendix is normal. No gross colonic mass. No colonic wall thickening. Vascular/Lymphatic: There is moderate atherosclerotic calcification of the abdominal aorta without aneurysm. There is no gastrohepatic or hepatoduodenal ligament lymphadenopathy. No retroperitoneal or mesenteric lymphadenopathy. No pelvic sidewall lymphadenopathy. Reproductive: Heterogeneous enhancement of the prostate gland with evidence of median lobe hypertrophy. Other: No intraperitoneal free fluid. Musculoskeletal: Degenerative changes are noted in the left hip. No worrisome lytic or sclerotic osseous abnormality. Degenerative disc disease noted L5-S1. IMPRESSION: 1. Fullness in the left intrarenal collecting system and ureter with possible mild urothelial enhancement in the mid left ureter. Imaging features could be related to a recently passed stone or urinary tract infection. Correlation with urinalysis recommended. 2. Heterogeneous enhancement of the prostate gland with evidence of median lobe hypertrophy. 3. Aortic atherosclerosis.  Electronically Signed   By: Kennith Center M.D.   On: 04/30/2023 05:59    Procedures Procedures    Medications Ordered in ED Medications  sodium chloride 0.9 % bolus 500 mL (0 mLs Intravenous Stopped 04/30/23 0431)  iohexol (OMNIPAQUE) 300 MG/ML solution 100 mL (100 mLs Intravenous Contrast Given 04/30/23 0525)  cefTRIAXone (ROCEPHIN) 1 g in sodium chloride 0.9 % 100 mL IVPB (0 g Intravenous Stopped 04/30/23 0645)    ED Course/ Medical Decision Making/ A&P                                 Medical Decision Making Amount and/or Complexity of Data Reviewed Labs: ordered. Radiology: ordered.  Risk Prescription drug management.   Patient here  for evaluation of urinary frequency, urgency with lower abdominal discomfort.  No abdominal tenderness on examination.  UA without bacteria present but does demonstrate numerous leuks, RBCs.  CT abdomen/pelvis was obtained, which demonstrates renal collecting system fullness, given picture would start on antibiotics for possible urinary tract infection.  Stone is less likely given his presenting symptoms.  Will start on antibiotics.  No evidence of sepsis.  Discussed close outpatient follow-up as well as return precautions.        Final Clinical Impression(s) / ED Diagnoses Final diagnoses:  Acute UTI    Rx / DC Orders ED Discharge Orders          Ordered    cefdinir (OMNICEF) 300 MG capsule  2 times daily        04/30/23 0606              Tilden Fossa, MD 04/30/23 [phone]

## 2023-05-02 LAB — URINE CULTURE: Culture: >=100000 — AB

## 2023-05-03 ENCOUNTER — Telehealth (HOSPITAL_BASED_OUTPATIENT_CLINIC_OR_DEPARTMENT_OTHER): Payer: Self-pay | Admitting: *Deleted

## 2023-05-03 NOTE — Telephone Encounter (Signed)
 Post ED Visit - Positive Culture Follow-up: Successful Patient Follow-Up  Culture assessed and recommendations reviewed by:  [x]  Sharin Mons Pharm.D. []  Celedonio Miyamoto, Pharm.D., BCPS AQ-ID []  Garvin Fila, Pharm.D., BCPS []  Georgina Pillion, Pharm.D., BCPS []  [place], [place].D., BCPS, AAHIVP []  Estella Husk, Pharm.D., BCPS, AAHIVP []  Lysle Pearl, PharmD, BCPS []  Phillips Climes, PharmD, BCPS []  Agapito Games, PharmD, BCPS []  Verlan Friends, PharmD  Positive urine culture  []  Patient discharged without antimicrobial prescription and treatment is now indicated []  Organism is resistant to prescribed ED discharge antimicrobial []  Patient with positive blood cultures  Changes discussed with ED provider: Estanislado Pandy New antibiotic prescription Nitrofurantoin 100 mg  twice daily for 5 days  Called to [facility]  Contacted patient, date 05/03/2023, time 0948   Bing Quarry 05/03/2023, 9:48 AM

## 2023-05-03 NOTE — Progress Notes (Signed)
 ED Antimicrobial Stewardship Positive Culture Follow Up   Adam Mathis is an 78 y.o. male who presented to [facility] on 04/30/2023 with a chief complaint of  Chief Complaint  Patient presents with   Abdominal Pain    Recent Results (from the past 720 hours)  Urine Culture     Status: Abnormal   Collection Time: 04/30/23  3:20 AM   Specimen: Urine, Catheterized  Result Value Ref Range Status   Specimen Description   Final    URINE, CATHETERIZED Performed at [facility], [address], [place], [place] [address]    Special Requests   Final    NONE Performed at [facility], [address], [place], [place] [address]    Culture >=100,000 COLONIES/mL ENTEROCOCCUS FAECALIS (A)  Final   Report Status 05/02/2023 FINAL  Final   Organism ID, Bacteria ENTEROCOCCUS FAECALIS (A)  Final      Susceptibility   Enterococcus faecalis - MIC*    AMPICILLIN <=2 SENSITIVE Sensitive     NITROFURANTOIN <=16 SENSITIVE Sensitive     VANCOMYCIN 1 SENSITIVE Sensitive     * >=100,000 COLONIES/mL ENTEROCOCCUS FAECALIS    [x]  Treated with cefdinir, organism resistant to prescribed antimicrobial []  Patient discharged originally without antimicrobial agent and treatment is now indicated  New antibiotic prescription: Stop cefdinir. Start nitrofurantoin 100 mg BID X 5 days   ED Provider: Estanislado Pandy, DO    Sharin Mons, PharmD, BCPS, BCIDP Infectious Diseases Clinical Pharmacist Phone: [phone] 05/03/2023, 9:13 AM

## 2023-05-10 ENCOUNTER — Other Ambulatory Visit (HOSPITAL_COMMUNITY): Payer: Self-pay | Admitting: Urology

## 2023-05-10 DIAGNOSIS — C7951 Secondary malignant neoplasm of bone: Secondary | ICD-10-CM

## 2023-05-10 DIAGNOSIS — C61 Malignant neoplasm of prostate: Secondary | ICD-10-CM

## 2023-05-17 DIAGNOSIS — H8113 Benign paroxysmal vertigo, bilateral: Secondary | ICD-10-CM | POA: Diagnosis not present

## 2023-05-17 DIAGNOSIS — I119 Hypertensive heart disease without heart failure: Secondary | ICD-10-CM | POA: Diagnosis not present

## 2023-05-17 DIAGNOSIS — E782 Mixed hyperlipidemia: Secondary | ICD-10-CM | POA: Diagnosis not present

## 2023-05-17 DIAGNOSIS — J301 Allergic rhinitis due to pollen: Secondary | ICD-10-CM | POA: Diagnosis not present

## 2023-05-17 DIAGNOSIS — G4733 Obstructive sleep apnea (adult) (pediatric): Secondary | ICD-10-CM | POA: Diagnosis not present

## 2023-05-17 DIAGNOSIS — H40063 Primary angle closure without glaucoma damage, bilateral: Secondary | ICD-10-CM | POA: Diagnosis not present

## 2023-05-17 DIAGNOSIS — R7303 Prediabetes: Secondary | ICD-10-CM | POA: Diagnosis not present

## 2023-05-17 DIAGNOSIS — N390 Urinary tract infection, site not specified: Secondary | ICD-10-CM | POA: Diagnosis not present

## 2023-05-17 DIAGNOSIS — M1612 Unilateral primary osteoarthritis, left hip: Secondary | ICD-10-CM | POA: Diagnosis not present

## 2023-05-17 DIAGNOSIS — N182 Chronic kidney disease, stage 2 (mild): Secondary | ICD-10-CM | POA: Diagnosis not present

## 2023-05-19 DIAGNOSIS — N390 Urinary tract infection, site not specified: Secondary | ICD-10-CM | POA: Diagnosis not present

## 2023-05-19 DIAGNOSIS — J301 Allergic rhinitis due to pollen: Secondary | ICD-10-CM | POA: Diagnosis not present

## 2023-05-19 DIAGNOSIS — H8111 Benign paroxysmal vertigo, right ear: Secondary | ICD-10-CM | POA: Diagnosis not present

## 2023-05-19 DIAGNOSIS — R350 Frequency of micturition: Secondary | ICD-10-CM | POA: Diagnosis not present

## 2023-05-19 DIAGNOSIS — R7303 Prediabetes: Secondary | ICD-10-CM | POA: Diagnosis not present

## 2023-05-19 DIAGNOSIS — N182 Chronic kidney disease, stage 2 (mild): Secondary | ICD-10-CM | POA: Diagnosis not present

## 2023-05-19 DIAGNOSIS — G4733 Obstructive sleep apnea (adult) (pediatric): Secondary | ICD-10-CM | POA: Diagnosis not present

## 2023-06-01 ENCOUNTER — Encounter (HOSPITAL_COMMUNITY)
Admission: RE | Admit: 2023-06-01 | Discharge: 2023-06-01 | Disposition: A | Discharge status: Home / Self Care | Source: Ambulatory Visit | Attending: Urology | Admitting: Urology

## 2023-06-01 ENCOUNTER — Ambulatory Visit (HOSPITAL_COMMUNITY)
Admission: RE | Admit: 2023-06-01 | Discharge: 2023-06-01 | Disposition: A | Discharge status: Home / Self Care | Source: Ambulatory Visit | Attending: Urology | Admitting: Urology

## 2023-06-01 DIAGNOSIS — M1990 Unspecified osteoarthritis, unspecified site: Secondary | ICD-10-CM | POA: Diagnosis not present

## 2023-06-01 DIAGNOSIS — C61 Malignant neoplasm of prostate: Secondary | ICD-10-CM | POA: Insufficient documentation

## 2023-06-01 DIAGNOSIS — C7951 Secondary malignant neoplasm of bone: Secondary | ICD-10-CM | POA: Diagnosis not present

## 2023-06-01 MED ORDER — TECHNETIUM TC 99M MEDRONATE IV KIT
20.0000 | PACK | Freq: Once | INTRAVENOUS | Status: AC | PRN
  Administered 2023-06-01: 19.5 via INTRAVENOUS

## 2023-06-10 DIAGNOSIS — R7401 Elevation of levels of liver transaminase levels: Secondary | ICD-10-CM | POA: Diagnosis not present

## 2023-06-10 DIAGNOSIS — C7951 Secondary malignant neoplasm of bone: Secondary | ICD-10-CM | POA: Diagnosis not present

## 2023-06-10 DIAGNOSIS — R3915 Urgency of urination: Secondary | ICD-10-CM | POA: Diagnosis not present

## 2023-06-28 DIAGNOSIS — J301 Allergic rhinitis due to pollen: Secondary | ICD-10-CM | POA: Diagnosis not present

## 2023-06-28 DIAGNOSIS — H8113 Benign paroxysmal vertigo, bilateral: Secondary | ICD-10-CM | POA: Diagnosis not present

## 2023-06-28 DIAGNOSIS — E782 Mixed hyperlipidemia: Secondary | ICD-10-CM | POA: Diagnosis not present

## 2023-06-28 DIAGNOSIS — G4733 Obstructive sleep apnea (adult) (pediatric): Secondary | ICD-10-CM | POA: Diagnosis not present

## 2023-06-28 DIAGNOSIS — N1831 Chronic kidney disease, stage 3a: Secondary | ICD-10-CM | POA: Diagnosis not present

## 2023-06-28 DIAGNOSIS — N390 Urinary tract infection, site not specified: Secondary | ICD-10-CM | POA: Diagnosis not present

## 2023-06-28 DIAGNOSIS — H40063 Primary angle closure without glaucoma damage, bilateral: Secondary | ICD-10-CM | POA: Diagnosis not present

## 2023-06-28 DIAGNOSIS — I119 Hypertensive heart disease without heart failure: Secondary | ICD-10-CM | POA: Diagnosis not present

## 2023-08-18 DIAGNOSIS — N1831 Chronic kidney disease, stage 3a: Secondary | ICD-10-CM | POA: Diagnosis not present

## 2023-08-18 DIAGNOSIS — I119 Hypertensive heart disease without heart failure: Secondary | ICD-10-CM | POA: Diagnosis not present

## 2023-08-18 DIAGNOSIS — E782 Mixed hyperlipidemia: Secondary | ICD-10-CM | POA: Diagnosis not present

## 2023-08-18 DIAGNOSIS — R3915 Urgency of urination: Secondary | ICD-10-CM | POA: Diagnosis not present

## 2023-08-18 DIAGNOSIS — R7303 Prediabetes: Secondary | ICD-10-CM | POA: Diagnosis not present

## 2023-09-13 DIAGNOSIS — N1831 Chronic kidney disease, stage 3a: Secondary | ICD-10-CM | POA: Diagnosis not present

## 2023-09-13 DIAGNOSIS — G4733 Obstructive sleep apnea (adult) (pediatric): Secondary | ICD-10-CM | POA: Diagnosis not present

## 2023-09-13 DIAGNOSIS — H40063 Primary angle closure without glaucoma damage, bilateral: Secondary | ICD-10-CM | POA: Diagnosis not present

## 2023-09-13 DIAGNOSIS — I119 Hypertensive heart disease without heart failure: Secondary | ICD-10-CM | POA: Diagnosis not present

## 2023-09-13 DIAGNOSIS — E782 Mixed hyperlipidemia: Secondary | ICD-10-CM | POA: Diagnosis not present

## 2023-09-13 DIAGNOSIS — H8113 Benign paroxysmal vertigo, bilateral: Secondary | ICD-10-CM | POA: Diagnosis not present

## 2023-09-13 DIAGNOSIS — J301 Allergic rhinitis due to pollen: Secondary | ICD-10-CM | POA: Diagnosis not present

## 2023-09-13 DIAGNOSIS — Z23 Encounter for immunization: Secondary | ICD-10-CM | POA: Diagnosis not present

## 2023-09-23 DIAGNOSIS — H401133 Primary open-angle glaucoma, bilateral, severe stage: Secondary | ICD-10-CM | POA: Diagnosis not present

## 2023-09-30 DIAGNOSIS — R7401 Elevation of levels of liver transaminase levels: Secondary | ICD-10-CM | POA: Diagnosis not present

## 2023-09-30 DIAGNOSIS — C7951 Secondary malignant neoplasm of bone: Secondary | ICD-10-CM | POA: Diagnosis not present

## 2023-09-30 DIAGNOSIS — R3915 Urgency of urination: Secondary | ICD-10-CM | POA: Diagnosis not present
